# Patient Record
Sex: Female | Born: 1960 | Race: White | Hispanic: No | Marital: Married | Smoking: Former smoker
Health system: Southern US, Community
[De-identification: ages and names within clinical notes are randomized; demographics above are authoritative.]

## PROBLEM LIST (undated history)

## (undated) DIAGNOSIS — D649 Anemia, unspecified: Secondary | ICD-10-CM

## (undated) DIAGNOSIS — G4730 Sleep apnea, unspecified: Secondary | ICD-10-CM

## (undated) DIAGNOSIS — E669 Obesity, unspecified: Secondary | ICD-10-CM

## (undated) DIAGNOSIS — J189 Pneumonia, unspecified organism: Secondary | ICD-10-CM

## (undated) DIAGNOSIS — K5900 Constipation, unspecified: Secondary | ICD-10-CM

## (undated) DIAGNOSIS — Z8759 Personal history of other complications of pregnancy, childbirth and the puerperium: Secondary | ICD-10-CM

## (undated) DIAGNOSIS — Z86718 Personal history of other venous thrombosis and embolism: Secondary | ICD-10-CM

## (undated) DIAGNOSIS — H109 Unspecified conjunctivitis: Secondary | ICD-10-CM

## (undated) DIAGNOSIS — H269 Unspecified cataract: Secondary | ICD-10-CM

## (undated) DIAGNOSIS — D039 Melanoma in situ, unspecified: Secondary | ICD-10-CM

## (undated) DIAGNOSIS — M899 Disorder of bone, unspecified: Secondary | ICD-10-CM

## (undated) DIAGNOSIS — C801 Malignant (primary) neoplasm, unspecified: Secondary | ICD-10-CM

## (undated) DIAGNOSIS — M1990 Unspecified osteoarthritis, unspecified site: Secondary | ICD-10-CM

## (undated) DIAGNOSIS — T7840XA Allergy, unspecified, initial encounter: Secondary | ICD-10-CM

## (undated) DIAGNOSIS — L570 Actinic keratosis: Secondary | ICD-10-CM

## (undated) DIAGNOSIS — E7800 Pure hypercholesterolemia, unspecified: Secondary | ICD-10-CM

## (undated) DIAGNOSIS — R140 Abdominal distension (gaseous): Secondary | ICD-10-CM

## (undated) DIAGNOSIS — I10 Essential (primary) hypertension: Secondary | ICD-10-CM

## (undated) HISTORY — DX: Allergy, unspecified, initial encounter: T78.40XA

## (undated) HISTORY — DX: Constipation, unspecified: K59.00

## (undated) HISTORY — DX: Pneumonia, unspecified organism: J18.9

## (undated) HISTORY — DX: Essential (primary) hypertension: I10

## (undated) HISTORY — DX: Unspecified cataract: H26.9

## (undated) HISTORY — PX: EYE SURGERY: SHX253

## (undated) HISTORY — DX: Abdominal distension (gaseous): R14.0

## (undated) HISTORY — DX: Pure hypercholesterolemia, unspecified: E78.00

## (undated) HISTORY — PX: APPENDECTOMY: SHX54

## (undated) HISTORY — DX: Personal history of other venous thrombosis and embolism: Z86.718

## (undated) HISTORY — DX: Melanoma in situ, unspecified: D03.9

## (undated) HISTORY — DX: Unspecified osteoarthritis, unspecified site: M19.90

## (undated) HISTORY — PX: MELANOMA EXCISION: SHX5266

## (undated) HISTORY — PX: TONSILLECTOMY: SUR1361

## (undated) HISTORY — PX: VEIN LIGATION AND STRIPPING: SHX2653

## (undated) HISTORY — DX: Malignant (primary) neoplasm, unspecified: C80.1

## (undated) HISTORY — PX: HERNIA REPAIR: SHX51

## (undated) HISTORY — DX: Sleep apnea, unspecified: G47.30

## (undated) HISTORY — DX: Unspecified conjunctivitis: H10.9

## (undated) HISTORY — DX: Anemia, unspecified: D64.9

## (undated) HISTORY — DX: Personal history of other complications of pregnancy, childbirth and the puerperium: Z87.59

## (undated) HISTORY — DX: Obesity, unspecified: E66.9

## (undated) HISTORY — DX: Actinic keratosis: L57.0

## (undated) HISTORY — DX: Disorder of bone, unspecified: M89.9

---
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
## 1978-01-23 HISTORY — PX: APPENDECTOMY: SHX54

## 1978-01-23 HISTORY — PX: OVARIAN CYST SURGERY: SHX726

## 1986-01-23 HISTORY — PX: HERNIA REPAIR: SHX51

## 1992-01-24 HISTORY — PX: ABDOMINAL HYSTERECTOMY: SHX81

## 1999-01-24 HISTORY — PX: THORACOTOMY: SHX5074

## 2012-11-12 ENCOUNTER — Encounter: Payer: Self-pay | Admitting: *Deleted

## 2012-11-12 ENCOUNTER — Encounter: Payer: Self-pay | Admitting: Cardiovascular Disease

## 2012-11-12 DIAGNOSIS — E669 Obesity, unspecified: Secondary | ICD-10-CM

## 2012-11-12 DIAGNOSIS — L570 Actinic keratosis: Secondary | ICD-10-CM | POA: Insufficient documentation

## 2012-11-12 DIAGNOSIS — E7800 Pure hypercholesterolemia, unspecified: Secondary | ICD-10-CM

## 2012-11-12 DIAGNOSIS — H109 Unspecified conjunctivitis: Secondary | ICD-10-CM

## 2012-11-12 DIAGNOSIS — M899 Disorder of bone, unspecified: Secondary | ICD-10-CM

## 2012-11-12 DIAGNOSIS — E785 Hyperlipidemia, unspecified: Secondary | ICD-10-CM | POA: Insufficient documentation

## 2012-11-13 ENCOUNTER — Ambulatory Visit (INDEPENDENT_AMBULATORY_CARE_PROVIDER_SITE_OTHER): Payer: BC Managed Care – PPO | Admitting: Cardiovascular Disease

## 2012-11-13 ENCOUNTER — Encounter: Payer: Self-pay | Admitting: Cardiovascular Disease

## 2012-11-13 VITALS — BP 150/94 | HR 61 | Ht 66.3 in | Wt 203.0 lb

## 2012-11-13 DIAGNOSIS — R079 Chest pain, unspecified: Secondary | ICD-10-CM | POA: Insufficient documentation

## 2012-11-13 DIAGNOSIS — R1013 Epigastric pain: Secondary | ICD-10-CM

## 2012-11-13 DIAGNOSIS — E7800 Pure hypercholesterolemia, unspecified: Secondary | ICD-10-CM

## 2012-11-13 DIAGNOSIS — K3189 Other diseases of stomach and duodenum: Secondary | ICD-10-CM

## 2012-11-13 DIAGNOSIS — R109 Unspecified abdominal pain: Secondary | ICD-10-CM

## 2012-11-13 NOTE — Assessment & Plan Note (Signed)
Atypical Seems more GI related.  F/U GB US.  With normal ECG can have ETT.  History of paraganglioma with facieal flushing that has returned F/U abdominal and chest CT with contrast

## 2012-11-13 NOTE — Progress Notes (Signed)
Patient ID: Leah Todd, female   DOB: [birth year], 52 y.o.   MRN: [id number] 52 yo referred by Dr Sheria Lang for chest pain.  Patient has had two episodes most recently Monday with chest and epigastric pain.  Associated nausea diaphoresis.  Mondays episode occurred at work after eating a left over lunch.  Sudden onset severe epigastric pain radiating to chest.  Went to bathroom and had diarhea.  Felt fine in about an hour. No history of CAD. 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  No history of GERD or GB disease.  CRF;s elevated lipids and family history     ROS: Denies fever, malais, weight loss, blurry vision, decreased visual acuity, cough, sputum, SOB, hemoptysis, pleuritic pain, palpitaitons, heartburn, abdominal pain, melena, lower extremity edema, claudication, or rash.  All other systems reviewed and negative   General: Affect appropriate Healthy:  appears stated age HEENT: normal Neck supple with no adenopathy JVP normal no bruits no thyromegaly Lungs clear with no wheezing and good diaphragmatic motion Heart:  S1/S2 no murmur,rub, gallop or click PMI normal Abdomen: benighn, BS positve, no tenderness, no AAA no bruit.  No HSM or HJR Distal pulses intact with no bruits No edema Neuro non-focal Skin warm and dry No muscular weakness  Medications Current Outpatient Prescriptions  Medication Sig Dispense Refill  . aspirin 81 MG tablet Take 81 mg by mouth daily.      Marland Kitchen atorvastatin (LIPITOR) 10 MG tablet Take 10 mg by mouth daily.      . Nutritional Supplements (ESTROVEN PO) Take 1 tablet by mouth daily.      Marland Kitchen terbinafine (LAMISIL) 250 MG tablet Take one tablet daily       No current facility-administered medications for this visit.    Allergies Codeine; Erythromycin; Penicillins; and Tetracyclines & related  Family History: No family history on file.  Social History: History   Social History  . Marital Status: Married    Spouse  Name: N/A    Number of Children: N/A  . Years of Education: N/A   Occupational History  . Not on file.   Social History Main Topics  . Smoking status: Former Games developer  . Smokeless tobacco: Not on file  . Alcohol Use: Yes     Comment: occasional  . Drug Use: No  . Sexual Activity: Not on file   Other Topics Concern  . Not on file   Social History Narrative  . No narrative on file    Electrocardiogram:  Today NsR rate 61 normal no change compared to 11/12/12 rate 72 in [facility] office   Assessment and Plan

## 2012-11-13 NOTE — Patient Instructions (Signed)
Your physician recommends that you schedule a follow-up appointment in:  AS NEEDED  Your physician recommends that you continue on your current medications as directed. Please refer to the Current Medication list given to you today.  CHEST AND ABD  CT  RUQ ULTRASOUND Your physician has requested that you have an exercise tolerance test. For further information please visit [email or web address] Please also follow instruction sheet, as given.

## 2012-11-13 NOTE — Assessment & Plan Note (Signed)
Cholesterol is at goal.  Continue current dose of statin and diet Rx.  No myalgias or side effects.  F/U  LFT's in 6 months. No results found for this basename: LDLCALC  LDL 96 primary labs this year

## 2012-11-15 ENCOUNTER — Ambulatory Visit (INDEPENDENT_AMBULATORY_CARE_PROVIDER_SITE_OTHER)
Admission: RE | Admit: 2012-11-15 | Discharge: 2012-11-15 | Disposition: A | Discharge status: Home / Self Care | Payer: BC Managed Care – PPO | Source: Ambulatory Visit | Attending: Cardiovascular Disease | Admitting: Cardiovascular Disease

## 2012-11-15 ENCOUNTER — Ambulatory Visit
Admission: RE | Admit: 2012-11-15 | Discharge: 2012-11-15 | Disposition: A | Discharge status: Home / Self Care | Payer: BC Managed Care – PPO | Source: Ambulatory Visit | Attending: Cardiovascular Disease | Admitting: Cardiovascular Disease

## 2012-11-15 DIAGNOSIS — R079 Chest pain, unspecified: Secondary | ICD-10-CM

## 2012-11-15 DIAGNOSIS — K3189 Other diseases of stomach and duodenum: Secondary | ICD-10-CM

## 2012-11-15 DIAGNOSIS — R109 Unspecified abdominal pain: Secondary | ICD-10-CM

## 2012-11-15 DIAGNOSIS — R1013 Epigastric pain: Secondary | ICD-10-CM

## 2012-11-15 MED ORDER — IOHEXOL 300 MG/ML  SOLN
100.0000 mL | Freq: Once | INTRAMUSCULAR | Status: AC | PRN
  Administered 2012-11-15: 100 mL via INTRAVENOUS

## 2012-11-18 ENCOUNTER — Telehealth: Payer: Self-pay | Admitting: Cardiovascular Disease

## 2012-11-18 NOTE — Telephone Encounter (Signed)
I advised patient that tests have not been reviewed by Dr. Eden Emms and that we will notify her once he has reviewed.  Patient verbalized understanding

## 2012-11-18 NOTE — Telephone Encounter (Signed)
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  Should have her primary help arrange this

## 2012-11-18 NOTE — Telephone Encounter (Signed)
New problem:  Pt states she would like to hear the results of her most recent test results. Please advise... Also, pt states please note her area code is 330 not 336... Thanks

## 2012-11-19 NOTE — Telephone Encounter (Signed)
Yes still needs ETT was referred for chest pain

## 2012-11-19 NOTE — Telephone Encounter (Signed)
I reviewed results with patient who would like copies of reports sent to her PCP, Dr. Juleen China at [facility] in [place].  Patient would like to know if Dr. Eden Emms still recommends the ETT in December.  I advised that he has not stated otherwise, but that I will verify with him and call her back.  Patient verbalized understanding.

## 2012-11-19 NOTE — Telephone Encounter (Signed)
Left patient a message regarding Dr. Fabio Bering advice that patient have ETT for evaluation of chest pain and advised patient to call back with questions or concerns.

## 2012-12-30 ENCOUNTER — Ambulatory Visit (INDEPENDENT_AMBULATORY_CARE_PROVIDER_SITE_OTHER): Payer: BC Managed Care – PPO | Admitting: Physician Assistant

## 2012-12-30 ENCOUNTER — Encounter (INDEPENDENT_AMBULATORY_CARE_PROVIDER_SITE_OTHER): Payer: Self-pay

## 2012-12-30 DIAGNOSIS — R079 Chest pain, unspecified: Secondary | ICD-10-CM

## 2012-12-30 NOTE — Progress Notes (Signed)
Exercise Treadmill Test  Pre-Exercise Testing Evaluation Rhythm: normal sinus  Rate: 66     Test  Exercise Tolerance Test Ordering MD: Charlton Haws, MD  Interpreting MD: Tereso Newcomer, PA-C  Unique Test No: 1  Treadmill:  1  Indication for ETT: chest pain - rule out ischemia  Contraindication to ETT: No   Stress Modality: exercise - treadmill  Cardiac Imaging Performed: non   Protocol: standard Bruce - maximal  Max BP:  189/80  Max MPHR (bpm):  166 85% MPR (bpm):  141  MPHR obtained (bpm):  151 % MPHR obtained:  91  Reached 85% MPHR (min:sec):  4:27 Total Exercise Time (min-sec):  6:00  Workload in METS:  6.8 Borg Scale: 14  Reason ETT Terminated:  patient's desire to stop    ST Segment Analysis At Rest: non-specific ST segment slurring With Exercise: non-specific ST changes  Other Information Arrhythmia:  No Angina during ETT:  absent (0) Quality of ETT:  diagnostic  ETT Interpretation:  normal - no evidence of ischemia by ST analysis  Comments: Fair exercise capacity. No chest pain. Normal BP response to exercise. No ST changes to suggest ischemia.   Recommendations: F/u with Dr. Charlton Haws as directed. Signed,  Tereso Newcomer, PA-C   12/30/2012 10:06 AM

## 2012-12-30 NOTE — Progress Notes (Signed)
Normal stress test

## 2015-01-21 IMAGING — US US ABDOMEN LIMITED
1 series · 14 of 25 positions shown · non-contrast
Comparison: None.

CLINICAL DATA: Epigastric pain

EXAM:
US ABDOMEN LIMITED - RIGHT UPPER QUADRANT

[Series 1: us abdomen limited · 0.30mm/px · 14 of 41 slices shown]
[im 1/41]
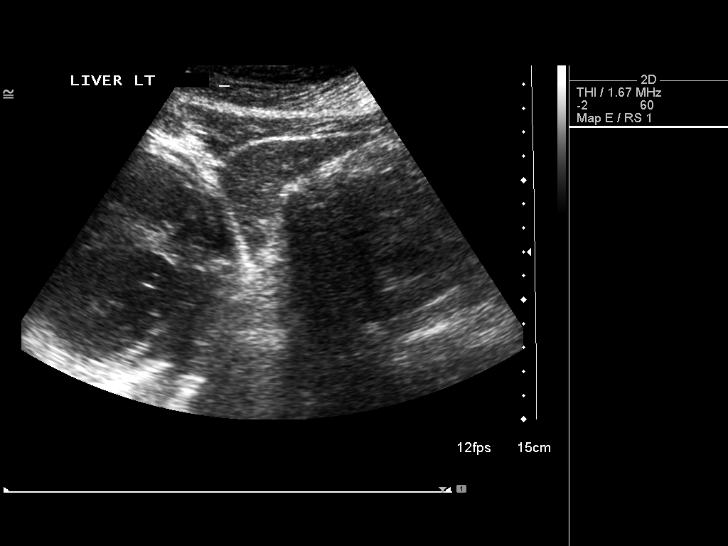
[im 4/41]
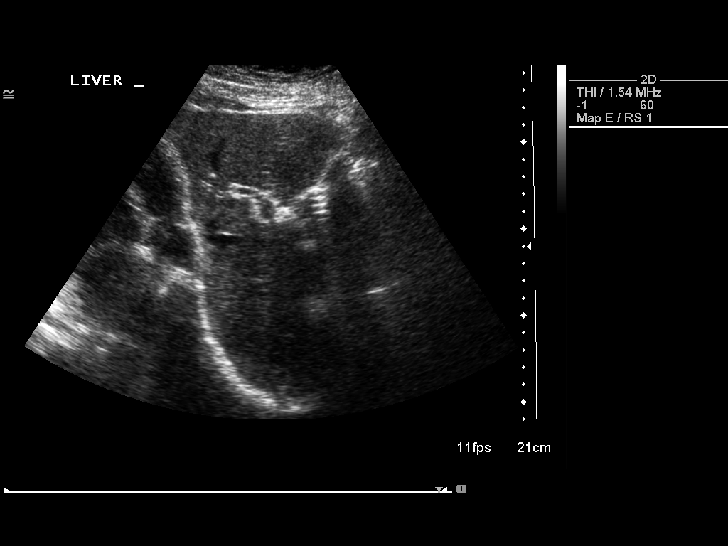
[im 7/41]
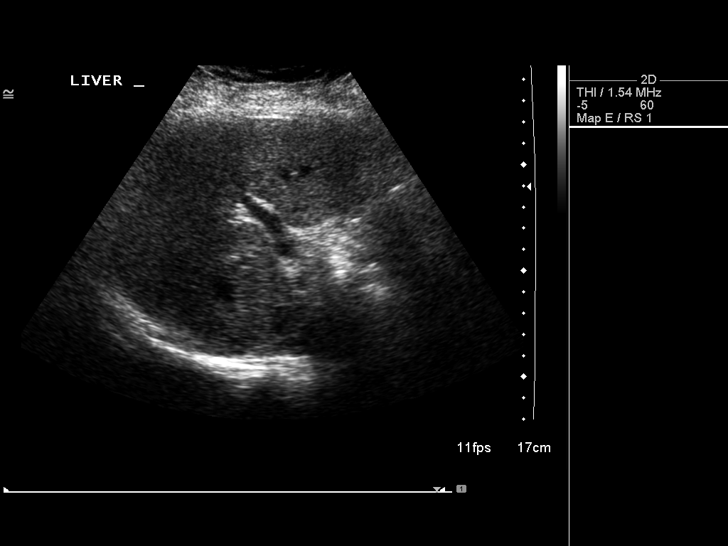
[im 11/41]
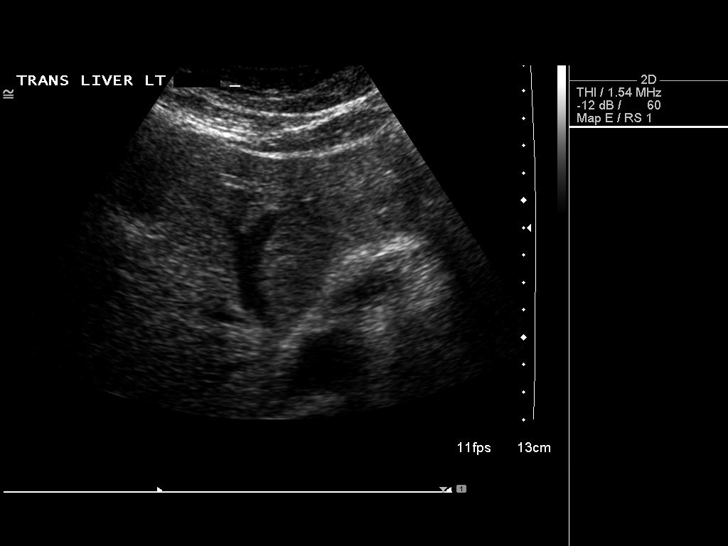
[im 14/41]
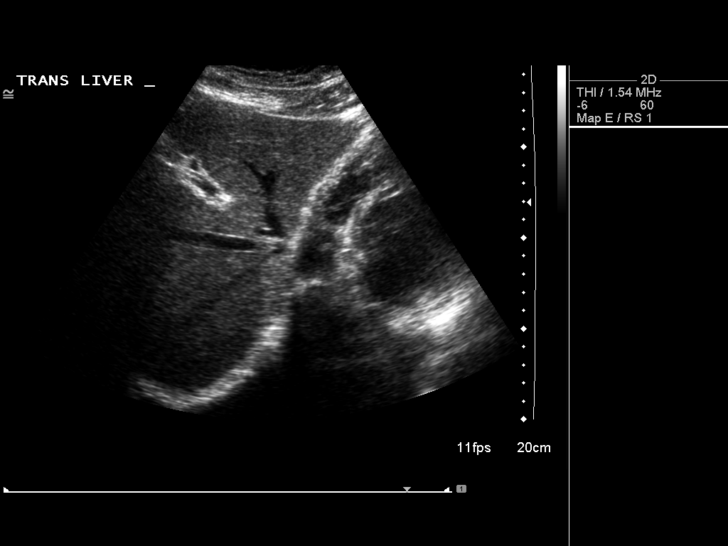
[im 16/41]
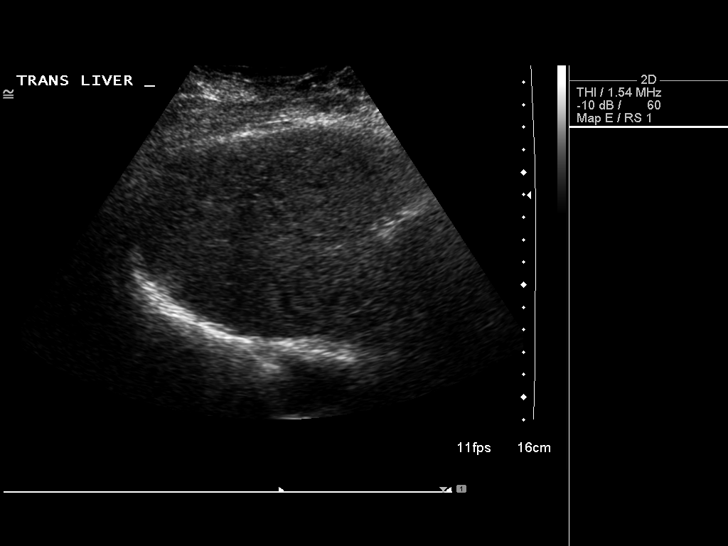
[im 19/41]
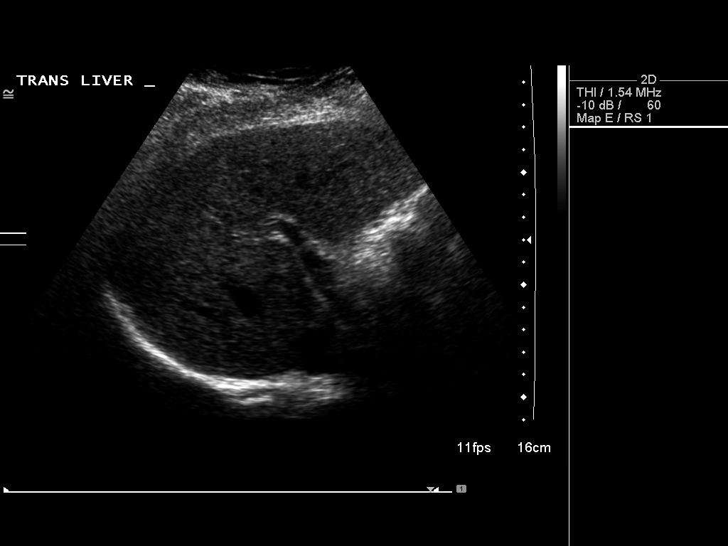
[im 22/41]
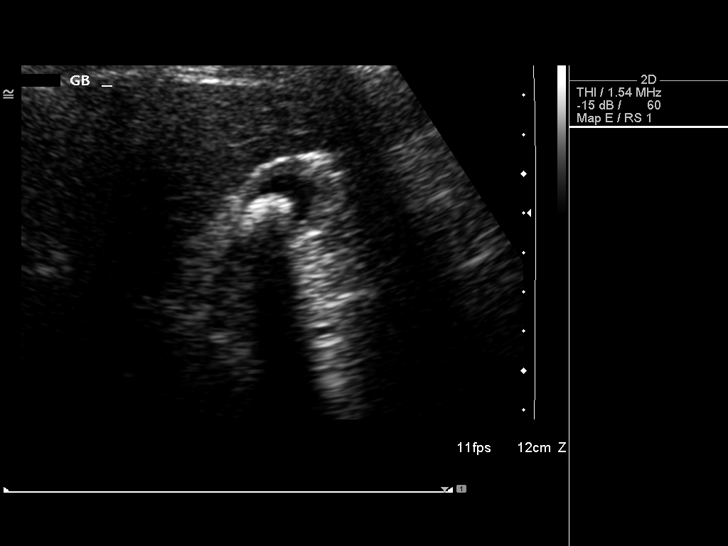
[im 26/41]
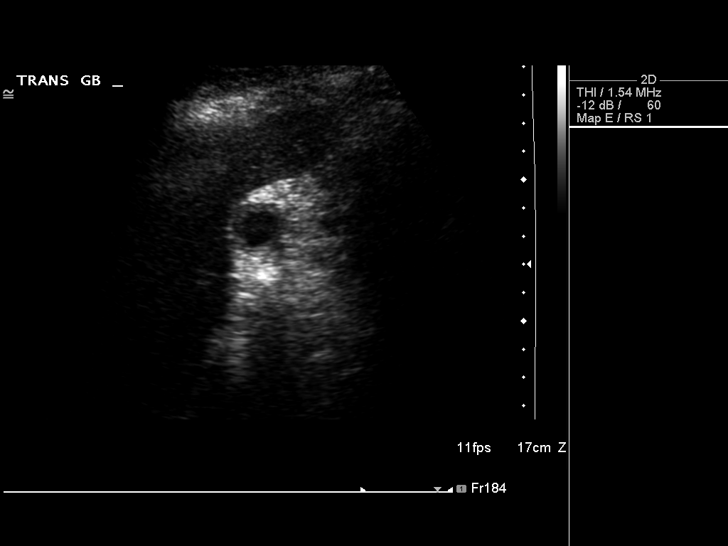
[im 27/41]
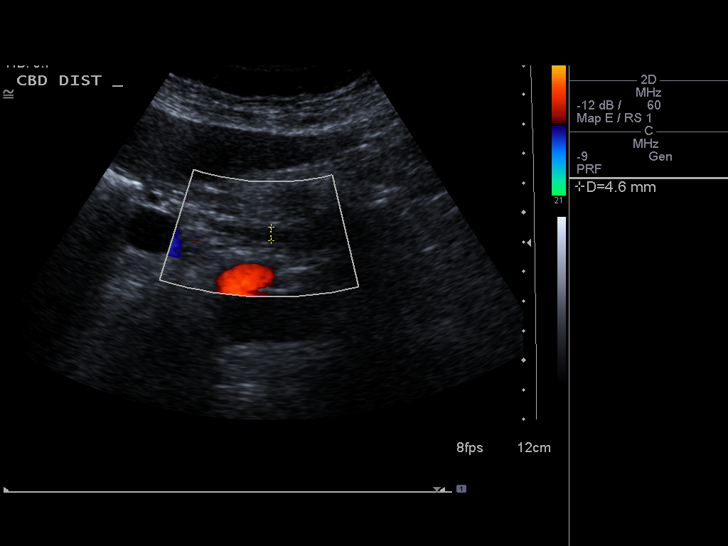
[im 31/41]
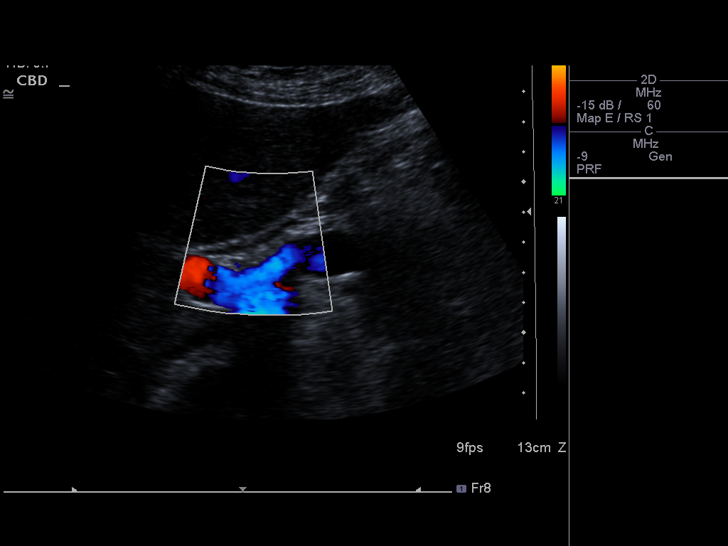
[im 34/41]
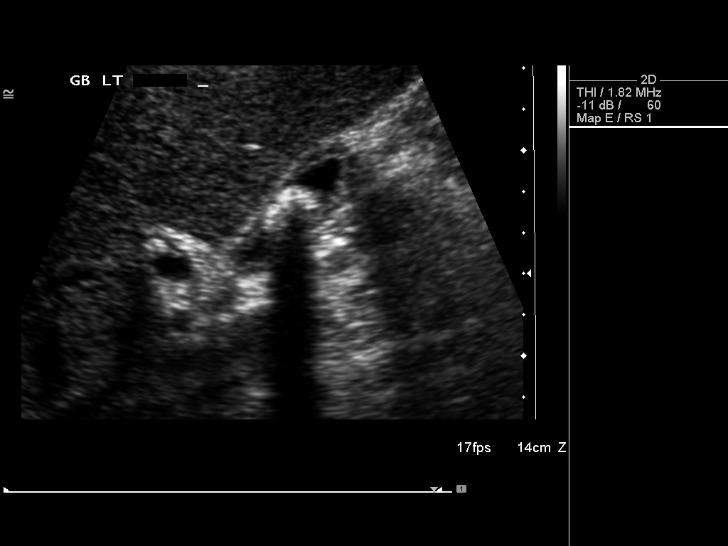
[im 37/41]
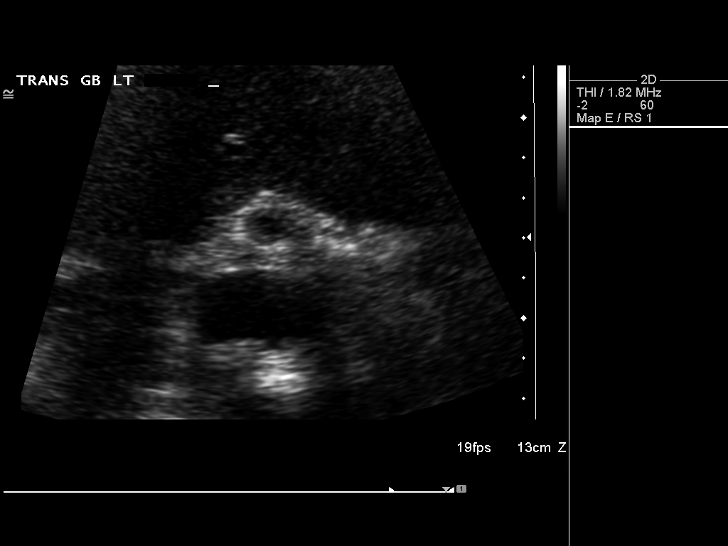
[im 41/41]
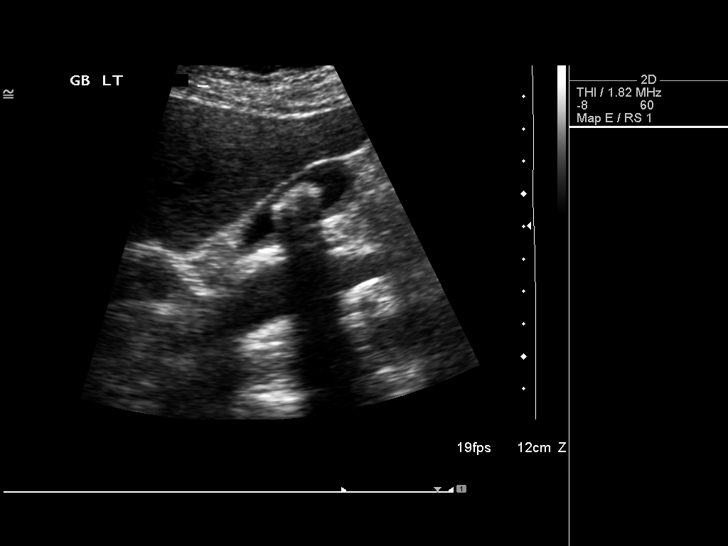

[14 of 25 positions shown; findings below may reference images not displayed]

FINDINGS: Gallbladder

1.8 cm gallstone within the gallbladder. Gallbladder is contracted
with mild wall thickening, 4 mm. Negative sonographic Andrea.

Common bile duct

Diameter: 5 mm.

Liver:

No focal lesion identified. Within normal limits in parenchymal
echogenicity.
IMPRESSION: Cholelithiasis. Contracted gallbladder with slight associated wall
thickening. Negative sonographic Andrea. I suspect the gallbladder
wall thickening is related to the contracted state.

## 2015-01-21 IMAGING — CT CT CHEST W/ CM
4 of 5 series · 17 of 36 positions shown, 19 images · IV contrast (omnipaque)
Comparison: None.

CLINICAL DATA: Chest and abdominal pain. Recurrent episodes of
flushing, diaphoresis and nausea.

EXAM:
CT CHEST AND ABDOMEN WITH CONTRAST
TECHNIQUE: Multidetector CT imaging of the abdomen was performed using the
standard protocol following bolus administration of intravenous
contrast.
CONTRAST:  100mL OMNIPAQUE IOHEXOL 300 MG/ML  SOLN

[Series 2: chest/abd with · axial · 0.73mm/px · z∈[-442,-222]mm · 5 of 89 slices shown]
[im 7/89  lung]
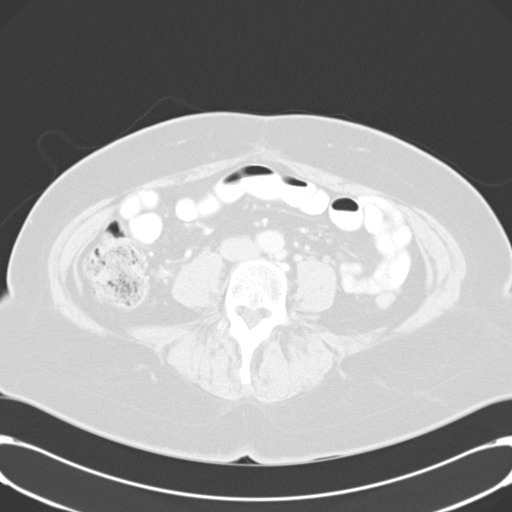
[im 19/89  lung]
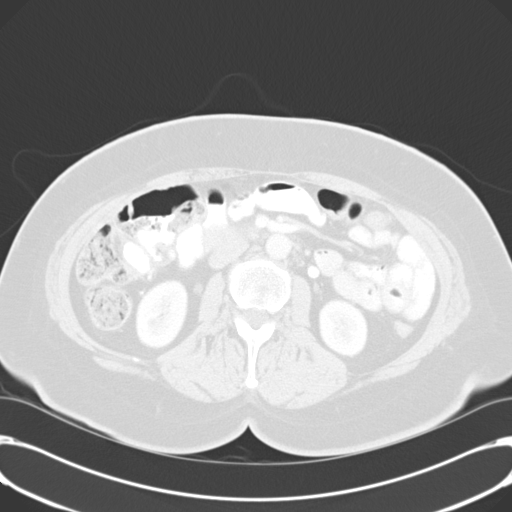
[im 32/89  lung]
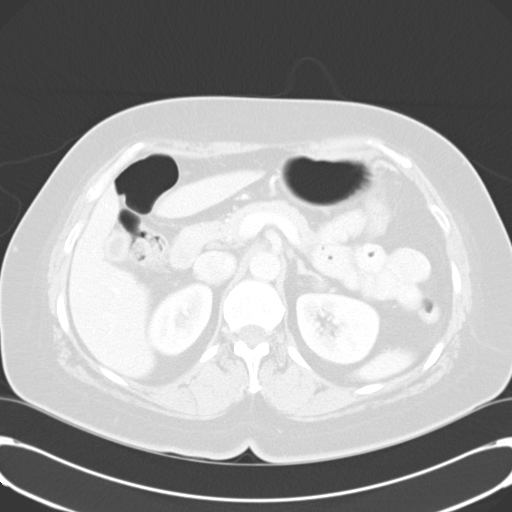
[im 38/89  lung]
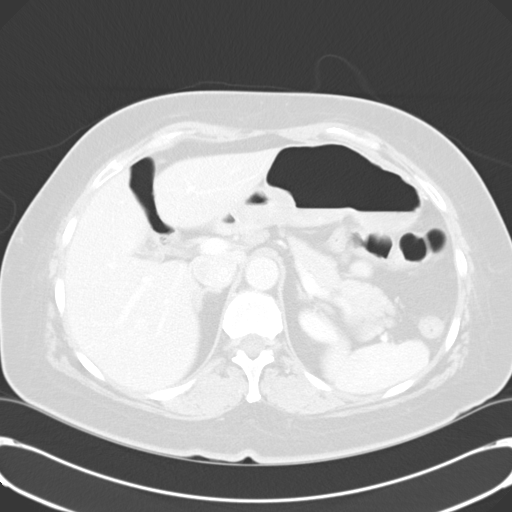
[im 51/89  lung]
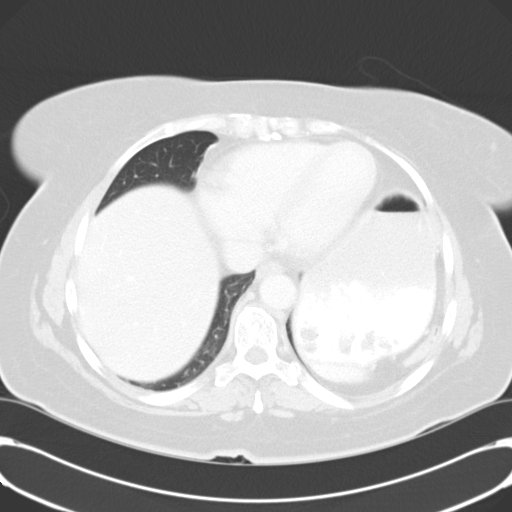

[Series 3: lung · axial · 0.73mm/px · z∈[-216,-66]mm · 6 of 44 slices shown, 8 images]
[im 7/44  mediastinal]
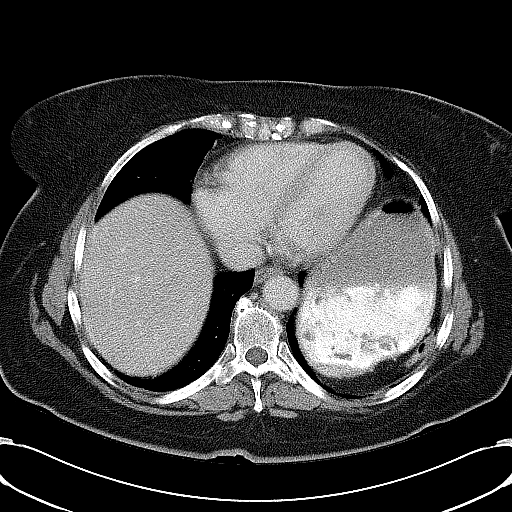
[im 7/44  lung]
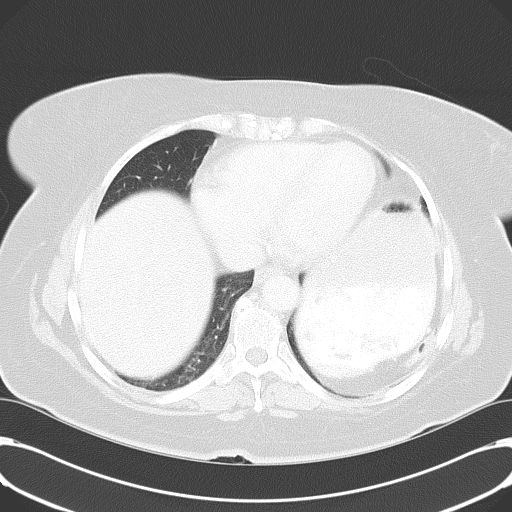
[im 13/44  lung]
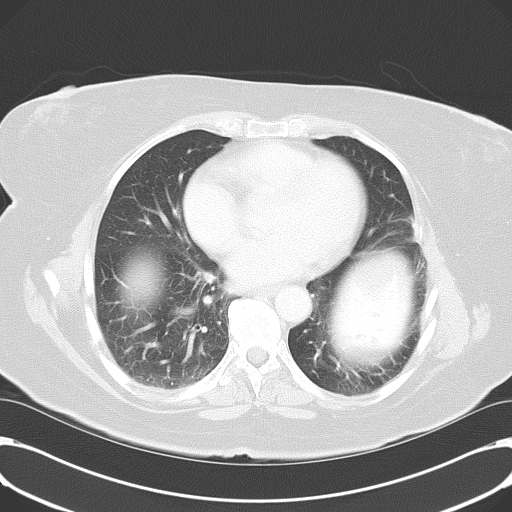
[im 19/44  lung]
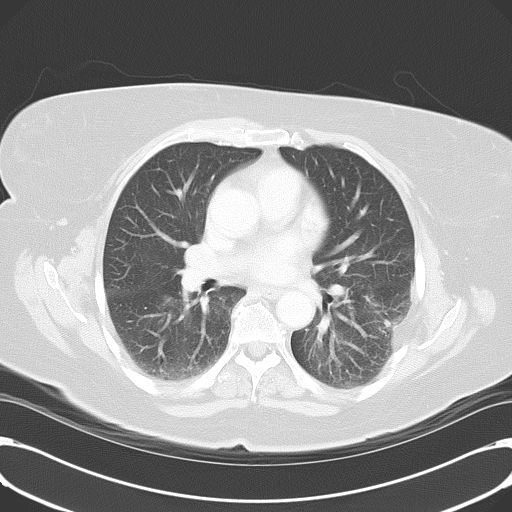
[im 25/44  lung]
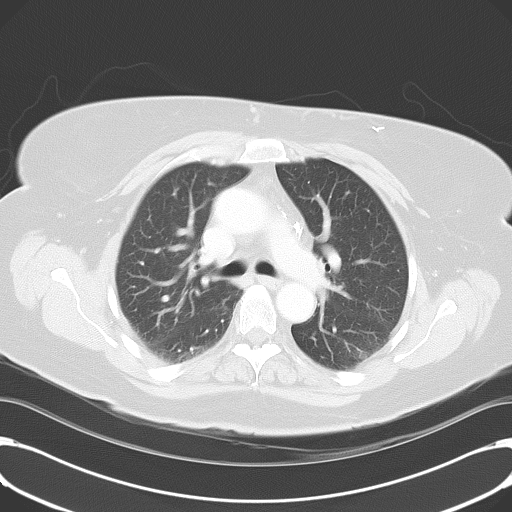
[im 31/44  mediastinal]
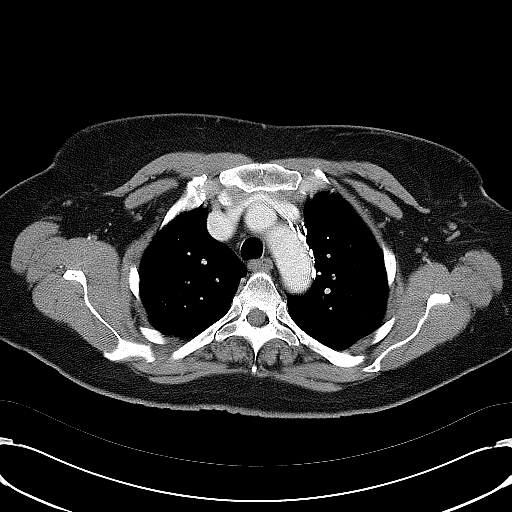
[im 31/44  lung]
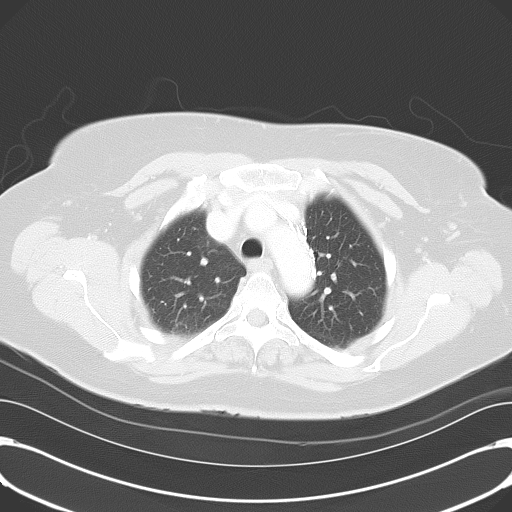
[im 37/44  lung]
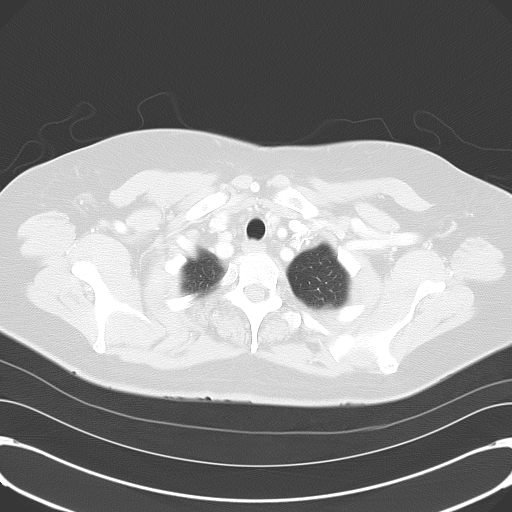

[Series 5: delay · axial · delayed · 0.69mm/px · z∈[-378,-308]mm · 3 of 30 slices shown]
[im 8/30  lung]
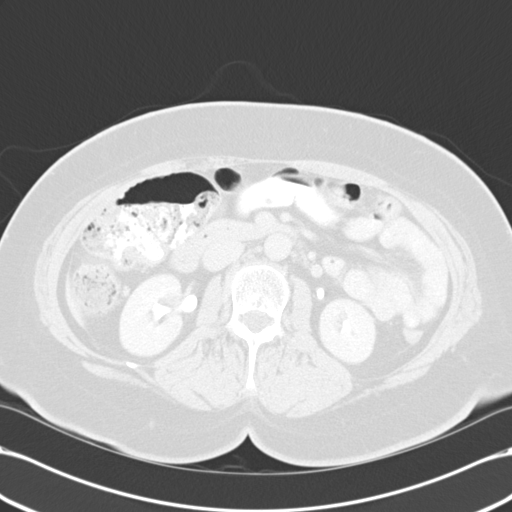
[im 15/30  lung]
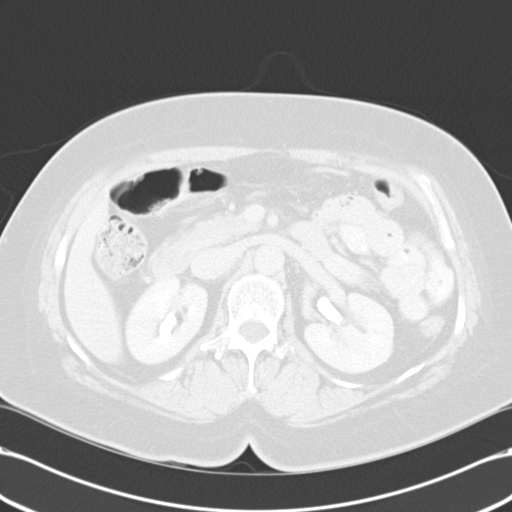
[im 22/30  lung]
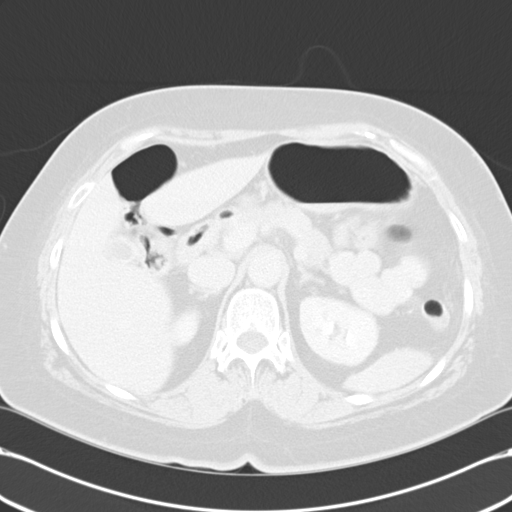

[Series 602: cor · coronal · 0.91mm/px · 3 of 125 slices shown]
[im 25/125  lung]
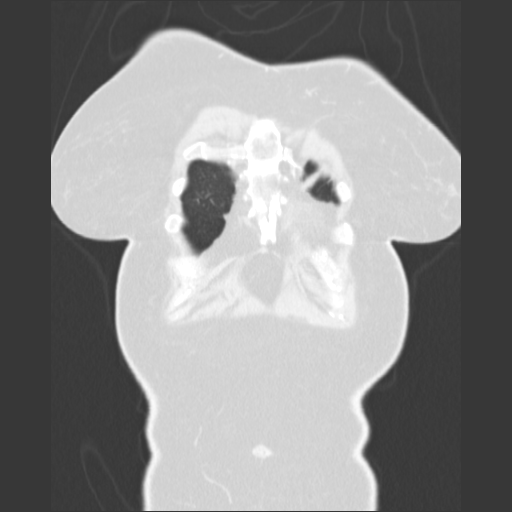
[im 50/125  lung]
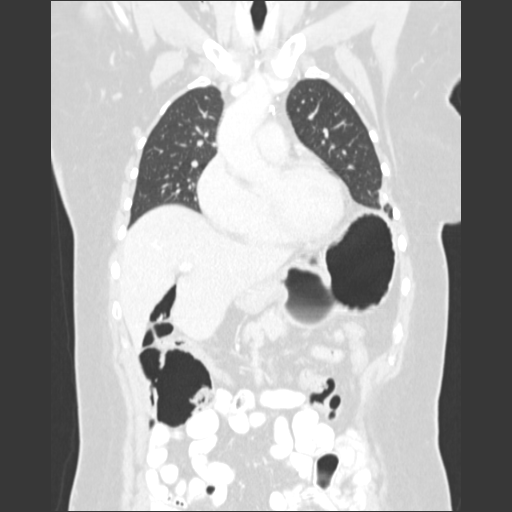
[im 75/125  lung]
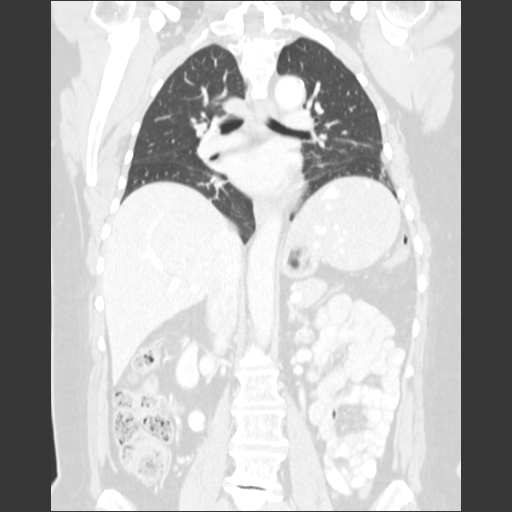

[17 of 36 positions shown; findings below may reference images not displayed]

FINDINGS: Chest CT findings: No evidence of mediastinal or hilar masses. No
adenopathy identified within the thorax.

Postop changes from previous left thoracotomy are noted. Mild
scarring seen in the lateral left lung base and left lung apex. No
evidence of pulmonary infiltrate or central endobronchial lesion. No
suspicious pulmonary nodules or masses are identified. No evidence
of chest wall mass or suspicious bone lesions.

Abdomen CT findings: A left adrenal mass is seen measuring 1.5 x
cm. This shows no significant contrast washout of contrast on
delayed imaging, which is a nonspecific feature. The right adrenal
gland is normal in appearance.

The liver, gallbladder, pancreas, spleen, and kidneys are normal in
appearance. No evidence of hydronephrosis.

No evidence of abdominal lymphadenopathy or other retroperitoneal
masses. No evidence of inflammatory process or abnormal fluid
collections. Visualized abdominal bowel loops are unremarkable.
IMPRESSION: Postop changes in left hemithorax. No thoracic mass or
lymphadenopathy.

2.0 cm nonspecific left adrenal mass. For patient with no prior
cancer history, this is presumably benign and a 12 month followup CT
or MRI is suggested for confirmation. If patient has clinical signs
or symptoms of adrenal hyperfunction, consider biochemical
evaluation. This recommendation follows ACR consensus guidelines:
Managing Incidental Findings on Abdominal CT: White Paper of the ACR
Incidental Findings Committee. [HOSPITAL] [id number];[DATE]

## 2015-11-25 ENCOUNTER — Ambulatory Visit (INDEPENDENT_AMBULATORY_CARE_PROVIDER_SITE_OTHER): Payer: BLUE CROSS/BLUE SHIELD | Admitting: Advanced Practice Midwife

## 2015-11-25 ENCOUNTER — Encounter: Payer: Self-pay | Admitting: Advanced Practice Midwife

## 2015-11-25 VITALS — BP 157/93 | HR 61 | Wt 211.8 lb

## 2015-11-25 DIAGNOSIS — Z86718 Personal history of other venous thrombosis and embolism: Secondary | ICD-10-CM | POA: Diagnosis not present

## 2015-11-25 DIAGNOSIS — N952 Postmenopausal atrophic vaginitis: Secondary | ICD-10-CM

## 2015-11-25 DIAGNOSIS — Z8759 Personal history of other complications of pregnancy, childbirth and the puerperium: Secondary | ICD-10-CM

## 2015-11-25 DIAGNOSIS — N941 Unspecified dyspareunia: Secondary | ICD-10-CM | POA: Diagnosis not present

## 2015-11-25 DIAGNOSIS — R635 Abnormal weight gain: Secondary | ICD-10-CM | POA: Diagnosis not present

## 2015-11-25 LAB — TSH: TSH: 3.19 m[IU]/L

## 2015-11-25 MED ORDER — PRASTERONE 6.5 MG VA INST: 1.0000 | VAGINAL_INSERT | Freq: Every day | VAGINAL | 13 refills | Status: DC

## 2015-11-25 NOTE — Patient Instructions (Signed)

## 2015-11-25 NOTE — Progress Notes (Signed)
Subjective:    Leah Todd is a 55 y.o. female who presents for vaginal dryness, discomfort and lack of interest in sex. Reports near-resolution of other menopausal Sx. States husband wanted her to see if anything could be done for this problem. Also C/O unexplained weight gain. The patient is sexually active. GYN screening history: last pap: ~age 61. Has hysterectomy due to severe pain from adhesions from previous pelvic surgery. No Hx abnormal Paps. and last mammogram: approximate date 05/04/15 and was normal. The patient is not taking hormone replacement therapy. Patient denies post-menopausal vaginal bleeding. The patient participates in regular exercise: no.    Menstrual History: OB History    No data available      No LMP recorded. Patient has had a hysterectomy.    The following portions of the patient's history were reviewed and updated as appropriate: allergies, current medications, past family history, past medical history, past social history, past surgical history and problem list.  Hx DVT postpartum Past Medical History:  Diagnosis Date  . Actinic keratosis   . Bone disorder   . Conjunctivitis unspecified   . Elevated cholesterol   . Obesity     Review of Systems Pertinent items are noted in HPI.    Objective:    BP (!) 157/93   Pulse 61   Wt 211 lb 12.8 oz (96.1 kg)   BMI 33.88 kg/m   General Appearance:    Alert, cooperative, no distress, appears stated age  Head:    Normocephalic, without obvious abnormality, atraumatic  Throat:   Lips, mucosa, and tongue normal; teeth normal  Neck:  Thyroid:  no enlargement/tenderness/nodules  Lungs:     Respirations unlabored   Heart:    Regular rate  Neurologic:   Grossly neurologically intact, normal gait       Assessment:     1. Post-menopausal atrophic vaginitis  - Prasterone 6.5 MG INST; Place 1 suppository vaginally daily.  Dispense: 28 each; Refill: 13  2. Dyspareunia, female  - Prasterone 6.5 MG INST;  Place 1 suppository vaginally daily.  Dispense: 28 each; Refill: 13  3. Unexplained weight gain  - TSH   Plan:  F/U PRN if no improvement in 2-3 months. May also try vaginal moisturize such as Replens. May consider Premarin cream if no improvement, but with caution considering pt's Hx of Estrogen-precipitated DVT.  Discussed Addyi for decreased libido, btu pt is not interested.   [place], CNM 11/24/2015 11:12 PM

## 2015-11-26 ENCOUNTER — Encounter: Payer: Self-pay | Admitting: Advanced Practice Midwife

## 2015-11-26 DIAGNOSIS — Z86718 Personal history of other venous thrombosis and embolism: Secondary | ICD-10-CM

## 2015-11-26 DIAGNOSIS — Z8759 Personal history of other complications of pregnancy, childbirth and the puerperium: Secondary | ICD-10-CM

## 2015-11-26 HISTORY — DX: Personal history of other complications of pregnancy, childbirth and the puerperium: Z87.59

## 2015-11-26 HISTORY — DX: Personal history of other venous thrombosis and embolism: Z86.718

## 2015-12-01 ENCOUNTER — Telehealth: Payer: Self-pay

## 2015-12-01 NOTE — Telephone Encounter (Signed)
Attempted to contact pt x 2 and received a message that number is not a working number.  Letter sent.

## 2020-12-23 HISTORY — PX: RETINAL DETACHMENT SURGERY: SHX105

## 2021-09-27 ENCOUNTER — Encounter: Payer: Self-pay | Admitting: Gastroenterology

## 2021-09-28 ENCOUNTER — Ambulatory Visit: Payer: BLUE CROSS/BLUE SHIELD | Admitting: Gastroenterology

## 2021-10-20 ENCOUNTER — Ambulatory Visit: Payer: BC Managed Care – PPO | Admitting: Pulmonary Disease

## 2021-10-20 ENCOUNTER — Encounter: Payer: Self-pay | Admitting: Pulmonary Disease

## 2021-10-20 VITALS — BP 118/82 | HR 76 | Ht 66.0 in | Wt 215.6 lb

## 2021-10-20 DIAGNOSIS — E873 Alkalosis: Secondary | ICD-10-CM | POA: Diagnosis not present

## 2021-10-20 DIAGNOSIS — R0609 Other forms of dyspnea: Secondary | ICD-10-CM

## 2021-10-20 NOTE — Patient Instructions (Addendum)
Nice to meet you   We will get pulmonary function tests and a home sleep test to evaluate the elevated bicarbonate in your blood  Return to clinic in 3 months or sooner as needed with Dr. Silas Flood  PFT next available

## 2021-10-25 NOTE — Progress Notes (Signed)
$'@Patient'P$  ID: Leah Todd, female    DOB: 12-20-1960, 61 y.o.   MRN: [id number]  Chief Complaint  Patient presents with   Consult    Referring provider: Ronita Hipps, MD  HPI:   61 y.o. woman whom are seen in consultation for evaluation of elevated bicarbonate, presumed metabolic alkalosis.  Note from referring provider reviewed.  Patient had lab work.  This revealed elevated CO2 low 30s.  She denies any significant dyspnea.  Mild exertional dyspnea she feels is normal.  No shortness of breath at rest.  She denies any history of sleep disordered breathing.  No issues sleeping.  Does not wake up at night etc.  No cough.  Reviewed etiologies of elevated bicarbonate for metabolic alkalosis.  We discussed that this often is from chronic respiratory acidosis.  In that work-up would revolve around trying to identify a possible respiratory acidosis or cause of respiratory acidosis.  PMH: Hypertension, hyperlipidemia Family history:History reviewed. No pertinent family history. Surgical history: Appendectomy, C-section, hernia repair, tonsillectomy, VATS Social history: Former smoker, lives in [place] / Pulmonary Flowsheets:   ACT:      No data to display          MMRC:     No data to display          Epworth:      No data to display          Tests:   FENO:  No results found for: "NITRICOXIDE"  PFT:     No data to display          WALK:      No data to display          Imaging: Personally reviewed No results found.  Lab Results: Personally reviewed CBC No results found for: "WBC", "RBC", "HGB", "HCT", "PLT", "MCV", "MCH", "MCHC", "RDW", "LYMPHSABS", "MONOABS", "EOSABS", "BASOSABS"  BMET No results found for: "NA", "K", "CL", "CO2", "GLUCOSE", "BUN", "CREATININE", "CALCIUM", "GFRNONAA", "GFRAA"  BNP No results found for: "BNP"  ProBNP No results found for: "PROBNP"  Specialty Problems   None   Allergies   Allergen Reactions   Codeine    Erythromycin    Penicillins    Tetracyclines & Related      There is no immunization history on file for this patient.  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   atorvastatin (LIPITOR) 10 MG tablet Take 10 mg by mouth daily.   b complex vitamins capsule Take 1 capsule by mouth daily.   Calcium Carbonate-Vitamin D (OYSTER SHELL CALCIUM/D) 500-5 MG-MCG TABS Take 1 tablet by mouth daily.   hydrochlorothiazide (MICROZIDE) 12.5 MG capsule Take 12.5 mg by mouth as needed.   Multiple Vitamin (MULTIVITAMIN) tablet Take 1 tablet by mouth daily.   Probiotic Product (PROBIOTIC PO) Take by mouth.   [DISCONTINUED] Nutritional Supplements (ESTROVEN PO) Take 1 tablet by mouth daily.   [DISCONTINUED] Prasterone 6.5 MG INST Place 1 suppository vaginally daily.   [  DISCONTINUED] terbinafine (LAMISIL) 250 MG tablet Take one tablet daily   No facility-administered encounter medications on file as of 10/20/2021.     Review of Systems  Review of Systems  No chest pain with exertion.  No orthopnea or PND.  Comprehensive review of systems otherwise negative. Physical Exam  BP 118/82 (BP Location: Left Arm, Cuff Size: Normal)   Pulse 76   Ht '5\' 6"'$  (1.676 m)   Wt 215 lb 9.6 oz (97.8 kg)   SpO2 97%   BMI 34.80 kg/m   Wt Readings from Last 5 Encounters:  10/20/21 215 lb 9.6 oz (97.8 kg)  11/25/15 211 lb 12.8 oz (96.1 kg)  11/13/12 203 lb (92.1 kg)    BMI Readings from Last 5 Encounters:  10/20/21 34.80 kg/m   11/25/15 33.88 kg/m  11/13/12 32.47 kg/m     Physical Exam General: Sitting in chair, no acute distress Eyes: EOMI, no icterus Neck: Supple, no JVP Pulmonary: Clear, normal work of breathing Cardiovascular: Warm, no edema Abdomen: Nondistended, bowel sounds present MSK: No synovitis, no joint effusion Neuro: Normal gait, no weakness Psych: Normal mood, full affect   Assessment & Plan:   Elevated Bicarbonate on routine blood tests/metabolic alkalosis: Unclear etiology.  Presumably compensation for respiratory acidosis.  Highest suspicion of OHS/atelectasis due to habitus, weight gain. Possible OSA. PFTs for further evaluation of possible obstructive lung disease which could be contributing. Home sleep study to evaluate for OSA.  All are agreeable, consider nephrology consultation.  Abnormal CT scan: 2014 with rounded appearing left-sided pleural change.  This is likely postprocedural following VATS for procedural hemothorax.  No further follow-up needed.  Return in about 3 months (around 01/19/2022).   Lanier Clam, MD 10/25/2021

## 2021-11-11 ENCOUNTER — Ambulatory Visit: Payer: BC Managed Care – PPO | Admitting: Gastroenterology

## 2021-11-11 ENCOUNTER — Encounter: Payer: Self-pay | Admitting: Gastroenterology

## 2021-11-11 VITALS — BP 136/82 | HR 68 | Ht 66.0 in | Wt 215.0 lb

## 2021-11-11 DIAGNOSIS — R143 Flatulence: Secondary | ICD-10-CM

## 2021-11-11 DIAGNOSIS — Z1211 Encounter for screening for malignant neoplasm of colon: Secondary | ICD-10-CM

## 2021-11-11 DIAGNOSIS — R140 Abdominal distension (gaseous): Secondary | ICD-10-CM | POA: Diagnosis not present

## 2021-11-11 MED ORDER — NA SULFATE-K SULFATE-MG SULF 17.5-3.13-1.6 GM/177ML PO SOLN: 1.0000 | Freq: Once | ORAL | 0 refills | Status: AC

## 2021-11-11 NOTE — Patient Instructions (Addendum)
It was a pleasure to meet you today.  We discussed a colonoscopy for colon cancer screening.  Some food may make your gas and bloating worse. Please avoid carbonated beverages, caffeinated beverages, sugar substitutes, hard candies, chewing gum and other gas-producing foods such as legumes, onions, cabbage, brussel sprouts, and artificial sweeteners. Do not gulp foods or liquids!   You have been scheduled for a colonoscopy. Please follow written instructions given to you at your visit today.  Please pick up your prep supplies at the pharmacy within the next 1-3 days. If you use inhalers (even only as needed), please bring them with you on the day of your procedure.   COLONOSCOPY TIPS:  To reduce nausea and dehydration, stay well hydrated for 3-4 days prior to the exam.  To prevent skin/hemorrhoid irritation - prior to wiping, put A&Dointment or vaseline on the toilet paper. Keep a towel or pad on the bed.  BEFORE STARTING YOUR PREP, drink  64oz of clear liquids in the morning. This will help to flush the colon and will ensure you are well hydrated!!!!  NOTE - This is in addition to the fluids required for to complete your prep. Use of a flavored hard candy, such as grape Anise Salvo, can counteract some of the flavor of the prep and may prevent some nausea.

## 2021-11-11 NOTE — Progress Notes (Signed)
Referring Provider: Ronita Hipps, MD Primary Care Physician:  Ronita Hipps, MD   Reason for Consultation:  Colonoscopy, Gas   IMPRESSION:  History of colon polyps.  Pathology unknown.  She is due for colon cancer screening as the last colonoscopy was over 10 years ago.  Gas and flatus.  No alarm features. Will start with dietary changes to minimize symptoms. If symptoms persist or alarm features develop, will plan cross-sectional imaging and consider medical therapy for constipation +/- SIBO.   PLAN: - Colonoscopy - Patient instructions for dietary recommendations   HPI: Leah Todd is a 61 y.o. female referred for colonoscopy.  This visit was scheduled prior to performing due to patient's complaints of gas.  The history is obtained through the patient, review of her electronic health record, and paper records provided by [facility] in [place].  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  She had a hysterectomy in 1994, appendectomy with oophorectomy in 1980, and a hernia repair in 1988. She is retired.   Presents for colonoscopy. She had some small polyps removed on her first colonoscopy 10 years ago with Dr. Lyda Jester.  She understood that she should have another exam in 10 years.  She has chronic gas with bloating and frequent flatus. Bowel habits fluctuate but she does not ever have diarrhea. Sometimes she will go a couple of days between bowel movements.  Gas is most frequent in the morning and the evenings after dinner and may be worse on days when she is constipated.  She will use GasEx if it gets bad. No improvement with fiber. No alarm features.   She drinks two cups of coffee every day. No significant consumption of carbonated beverages, sugar substitutes, hard candies, or chewing gum. She does not smoke.  She noted some decrease in her memory after anesthesia for eye surgery 12/23.  She thinks she was over Gast and finds  that she is no longer able to concentrate on more than one task at a time.  Recent labs show hemoglobin A1c 5.3, hemoglobin 12.7, MCV 90, RDW 13.1, platelets 256, normal liver enzymes, normal TSH  There is no known family history of colon cancer or polyps. No family history of stomach cancer or other GI malignancy. No family history of inflammatory bowel disease or celiac.    Past Medical History:  Diagnosis Date   Actinic keratosis    Bloating    Bone disorder    Cancer (HCC)    Conjunctivitis unspecified    Constipation    Elevated cholesterol    History of maternal deep vein thrombosis (DVT) 11/26/2015   Hypertension    Melanoma in situ ([place])    Obesity    Pneumonia     Past Surgical History:  Procedure Laterality Date   ABDOMINAL HYSTERECTOMY  [place]   RETINAL DETACHMENT SURGERY Left 12/2020   THORACOTOMY  2001   TONSILLECTOMY  1972   VEIN LIGATION AND [place], 2006 & 2014   Current Outpatient Medications  Medication Sig Dispense Refill   aspirin EC 81 MG tablet Take 81 mg by mouth daily.     atorvastatin (LIPITOR) 10 MG tablet Take 10 mg by mouth daily.  Calcium Carbonate-Vitamin D (OYSTER SHELL CALCIUM/D) 500-5 MG-MCG TABS Take 1 tablet by mouth daily.     hydrochlorothiazide (MICROZIDE) 12.5 MG capsule Take 12.5 mg by mouth as needed.     Multiple Vitamin (MULTIVITAMIN) tablet Take 1 tablet by mouth daily.     NON FORMULARY Pt taking 1 Golo a day     Probiotic Product (PROBIOTIC PO) Take by mouth.     b complex vitamins capsule Take 1 capsule by mouth daily. (Patient not taking: Reported on 11/11/2021)     No current facility-administered medications for this visit.    Allergies as of 11/11/2021 - Review Complete 11/11/2021  Allergen Reaction Noted   Codeine  11/12/2012   Erythromycin  11/12/2012    Penicillins  11/12/2012   Tetracyclines & related  11/12/2012    Family History  Problem Relation Age of Onset   Stroke Mother    Bone cancer Father    Heart attack Father    Hypertension Sister    Hypertension Brother    Colon polyps Brother    Stomach cancer Brother    Diabetes Maternal Grandmother    Diabetes Maternal Grandfather    Diabetes Paternal Grandmother    Colon cancer Neg Hx    Esophageal cancer Neg Hx     Social History   Socioeconomic History   Marital status: Married    Spouse name: Not on file   Number of children: 2   Years of education: Not on file   Highest education level: Not on file  Occupational History   Occupation: retired  Tobacco Use   Smoking status: Former   Smokeless tobacco: Not on Landscape architect Use: Never used  Substance and Sexual Activity   Alcohol use: Not Currently    Comment: occasional   Drug use: No   Sexual activity: Not on file  Other Topics Concern   Not on file  Social History Narrative   Not on file   Social Determinants of Health   Financial Resource Strain: Not on file  Food Insecurity: Not on file  Transportation Needs: Not on file  Physical Activity: Not on file  Stress: Not on file  Social Connections: Not on file  Intimate Partner Violence: Not on file    Review of Systems: 12 system ROS is negative except as noted above.   Physical Exam: General:   Alert,  well-nourished, pleasant and cooperative in NAD Head:  Normocephalic and atraumatic. Eyes:  Sclera clear, no icterus.   Conjunctiva pink. Ears:  Normal auditory acuity. Nose:  No deformity, discharge,  or lesions. Mouth:  No deformity or lesions.   Neck:  Supple; no masses or thyromegaly. Lungs:  Clear throughout to auscultation.   No wheezes. Heart:  Regular rate and rhythm; no murmurs. Abdomen:  Soft, nontender, nondistended, normal bowel sounds, no rebound or guarding. No hepatosplenomegaly.   Rectal:  Deferred  Msk:   Symmetrical. No boney deformities LAD: No inguinal or umbilical LAD Extremities:  No clubbing or edema. Neurologic:  Alert and  oriented x4;  grossly nonfocal Skin:  Intact without significant lesions or rashes. Psych:  Alert and cooperative. Normal mood and affect.   Tayllor Breitenstein L. Tarri Glenn, MD, MPH 11/11/2021, 11:00 AM

## 2021-12-23 HISTORY — PX: EYE SURGERY: SHX253

## 2021-12-28 ENCOUNTER — Encounter: Payer: Self-pay | Admitting: Pulmonary Disease

## 2021-12-28 ENCOUNTER — Ambulatory Visit (INDEPENDENT_AMBULATORY_CARE_PROVIDER_SITE_OTHER): Payer: BC Managed Care – PPO | Admitting: Pulmonary Disease

## 2021-12-28 ENCOUNTER — Encounter: Payer: Self-pay | Admitting: Gastroenterology

## 2021-12-28 ENCOUNTER — Ambulatory Visit: Payer: BC Managed Care – PPO | Admitting: Pulmonary Disease

## 2021-12-28 VITALS — BP 130/68 | HR 74 | Ht 65.7 in | Wt 212.8 lb

## 2021-12-28 DIAGNOSIS — E873 Alkalosis: Secondary | ICD-10-CM

## 2021-12-28 DIAGNOSIS — R0609 Other forms of dyspnea: Secondary | ICD-10-CM

## 2021-12-28 LAB — PULMONARY FUNCTION TEST
DL/VA % pred: 98 %
DL/VA: 4.08 ml/min/mmHg/L
DLCO cor % pred: 82 %
DLCO cor: 17.58 ml/min/mmHg
DLCO unc % pred: 82 %
DLCO unc: 17.58 ml/min/mmHg
FEF 25-75 Post: 1.68 L/sec
FEF 25-75 Pre: 1.8 L/sec
FEF2575-%Change-Post: -6 %
FEF2575-%Pred-Post: 71 %
FEF2575-%Pred-Pre: 76 %
FEV1-%Change-Post: 0 %
FEV1-%Pred-Post: 74 %
FEV1-%Pred-Pre: 75 %
FEV1-Post: 2 L
FEV1-Pre: 2.02 L
FEV1FVC-%Change-Post: 0 %
FEV1FVC-%Pred-Pre: 101 %
FEV6-%Change-Post: -1 %
FEV6-%Pred-Post: 75 %
FEV6-%Pred-Pre: 76 %
FEV6-Post: 2.54 L
FEV6-Pre: 2.57 L
FEV6FVC-%Change-Post: 0 %
FEV6FVC-%Pred-Post: 103 %
FEV6FVC-%Pred-Pre: 103 %
FVC-%Change-Post: -1 %
FVC-%Pred-Post: 73 %
FVC-%Pred-Pre: 73 %
FVC-Post: 2.54 L
FVC-Pre: 2.57 L
Post FEV1/FVC ratio: 79 %
Post FEV6/FVC ratio: 100 %
Pre FEV1/FVC ratio: 79 %
Pre FEV6/FVC Ratio: 100 %
RV % pred: 91 %
RV: 1.97 L
TLC % pred: 87 %
TLC: 4.67 L

## 2021-12-28 MED ORDER — STIOLTO RESPIMAT 2.5-2.5 MCG/ACT IN AERS: 2.0000 | INHALATION_SPRAY | Freq: Every day | RESPIRATORY_TRACT | 0 refills | Status: DC

## 2021-12-28 NOTE — Addendum Note (Signed)
Addended by: Retia Passe on: 12/28/2021 02:35 PM   Modules accepted: Orders

## 2021-12-28 NOTE — Progress Notes (Signed)
PFT done today. 

## 2021-12-28 NOTE — Patient Instructions (Signed)
Nice to see you again  Your pulmonary function test today are essentially normal.  No explanation for the elevated bicarbonate on blood work.  Overall, the results are very reassuring.  Once the sleep test is done, if you have not heard from me in a few days please send me a message and let me know.  Sometimes the results do not come to my inbox.  For the nasal congestion, it is okay to increase your Flonase to twice a day for a week or 2 to see if this helps at night.  If that not making significant improvement, you can use sinus rinses.  There is a brand called NeilMed.  You can buy it over-the-counter in the cold and flu.  Is just a squirt bottle.  You mix it with salt packets that should be right by the bottle.  Mix salt packets with distilled or purified water and flush out each nostril once or twice a day.  Check your oxygen at rest and during and after walks or exercise.  I would expect her oxygen to increase a bit with exercise compared to at rest.  If it does this, this is reassuring.  If not we will order the oxygen drops, let me know and we can do further evaluation.  Return to clinic as needed based on results of the sleep test

## 2021-12-28 NOTE — Progress Notes (Signed)
$'@Patient'I$  ID: Leah Todd, female    DOB: [birth year], 61 y.o.   MRN: [id number]  Chief Complaint  Patient presents with   Follow-up    Pt is here for follow up for DOE. Pt had full pfts done this office visit. I have to check on home sleep test that was ordered in October. No inhalers noted at this moment.     Referring provider: Ronita Hipps, MD  HPI:   61 y.o. woman whom are seeing in follow-up for evaluation of elevated bicarbonate, presumed metabolic alkalosis.    Overall doing fine.  Minimal dyspnea.  Exercising a bit more.  Walking more.  States that her oxygen saturations at 91%.  She is concerned about this.  PFTs performed today.  See below for full dictation but largely normal PFTs.  Home sleep study not yet scheduled.  HPI initial visit: Patient had lab work.  This revealed elevated CO2 low 30s.  She denies any significant dyspnea.  Mild exertional dyspnea she feels is normal.  No shortness of breath at rest.  She denies any history of sleep disordered breathing.  No issues sleeping.  Does not wake up at night etc.  No cough.  Reviewed etiologies of elevated bicarbonate for metabolic alkalosis.  We discussed that this often is from chronic respiratory acidosis.  In that work-up would revolve around trying to identify a possible respiratory acidosis or cause of respiratory acidosis.  PMH: Hypertension, hyperlipidemia Surgical history: Appendectomy, C-section, hernia repair, tonsillectomy, VATS Social history: Former smoker, lives in [place] / Pulmonary Flowsheets:   ACT:      No data to display           MMRC:     No data to display           Epworth:      No data to display           Tests:   FENO:  No results found for: "NITRICOXIDE"  PFT:    Latest Ref Rng & Units 12/28/2021    9:59 AM  PFT Results  FVC-Pre L 2.57  P  FVC-Predicted Pre % 73  P  FVC-Post L 2.54  P  FVC-Predicted Post % 73  P  Pre FEV1/FVC % % 79   P  Post FEV1/FCV % % 79  P  FEV1-Pre L 2.02  P  FEV1-Predicted Pre % 75  P  FEV1-Post L 2.00  P  DLCO uncorrected ml/min/mmHg 17.58  P  DLCO UNC% % 82  P  DLCO corrected ml/min/mmHg 17.58  P  DLCO COR %Predicted % 82  P  DLVA Predicted % 98  P  TLC L 4.67  P  TLC % Predicted % 87  P  RV % Predicted % 91  P    P Preliminary result   Personally reviewed interpreted spirometry suggestive of mild restriction versus air trapping.  No bronchodilator response.  Lung volumes within normal limits.  DLCO within normal limits.  WALK:      No data to display           Imaging: Personally reviewed No results found.  Lab Results: Personally reviewed CBC No results found for: "WBC", "RBC", "HGB", "HCT", "PLT", "MCV", "MCH", "MCHC", "RDW", "LYMPHSABS", "MONOABS", "EOSABS", "BASOSABS"  BMET No results found for: "NA", "K", "CL", "CO2", "GLUCOSE", "BUN", "CREATININE", "CALCIUM", "GFRNONAA", "GFRAA"  BNP No results found for: "BNP"  ProBNP No results found for: "PROBNP"  Specialty Problems  None   Allergies  Allergen Reactions   Codeine    Erythromycin    Penicillins    Tetracyclines & Related      There is no immunization history on file for this patient.  Past Medical History:  Diagnosis Date   Actinic keratosis    Bloating    Bone disorder    Cancer (HCC)    Conjunctivitis unspecified    Constipation    Elevated cholesterol    History of maternal deep vein thrombosis (DVT) 11/26/2015   Hypertension    Melanoma in situ ([place])    Obesity    Pneumonia     Tobacco History: Social History   Tobacco Use  Smoking Status Former  Smokeless Tobacco Not on file   Counseling given: Not Answered   Continue to not smoke  Outpatient Encounter Medications as of 12/28/2021  Medication Sig   aspirin EC 81 MG tablet Take 81 mg by mouth daily.   atorvastatin (LIPITOR) 10 MG tablet Take 10 mg by mouth daily.   Calcium Carbonate-Vitamin D (OYSTER SHELL CALCIUM/D)  500-5 MG-MCG TABS Take 1 tablet by mouth daily.   hydrochlorothiazide (HYDRODIURIL) 12.5 MG tablet Take 12.5 mg by mouth daily.   hydrochlorothiazide (MICROZIDE) 12.5 MG capsule Take 12.5 mg by mouth as needed.   Multiple Vitamin (MULTIVITAMIN) tablet Take 1 tablet by mouth daily.   NON FORMULARY Pt taking 1 Golo a day   Probiotic Product (PROBIOTIC PO) Take by mouth.   [DISCONTINUED] b complex vitamins capsule Take 1 capsule by mouth daily. (Patient not taking: Reported on 11/11/2021)   No facility-administered encounter medications on file as of 12/28/2021.     Review of Systems  Review of Systems  N/a Physical Exam  Pulse 74   Ht 5' 5.7" (1.669 m)   Wt 212 lb 12.8 oz (96.5 kg)   SpO2 91%   BMI 34.66 kg/m   Wt Readings from Last 5 Encounters:  12/28/21 212 lb 12.8 oz (96.5 kg)  11/11/21 215 lb (97.5 kg)  10/20/21 215 lb 9.6 oz (97.8 kg)  11/25/15 211 lb 12.8 oz (96.1 kg)  11/13/12 203 lb (92.1 kg)    BMI Readings from Last 5 Encounters:  12/28/21 34.66 kg/m  11/11/21 34.70 kg/m  10/20/21 34.80 kg/m  11/25/15 33.88 kg/m  11/13/12 32.47 kg/m     Physical Exam General: Sitting in chair, no acute distress Eyes: EOMI, no icterus Neck: Supple, no JVP Pulmonary: Clear, normal work of breathing Cardiovascular: Warm, no edema Abdomen: Nondistended, bowel sounds present MSK: No synovitis, no joint effusion Neuro: Normal gait, no weakness Psych: Normal mood, full affect   Assessment & Plan:   Elevated Bicarbonate on routine blood tests/metabolic alkalosis: Unclear etiology.  Presumably compensation for respiratory acidosis.  Highest suspicion of OHS/atelectasis due to habitus, weight gain. Possible OSA.  PFTs 12/2021 within normal notes, no obstruction, etc. to explain possible hypercapnia.  Still waiting for home sleep test.  If normal, no further follow-up needed.  Low oxygen: Not low enough to the oxygen.  Notes oxygen saturation low 90s.  Discussed likely  related to atelectasis in the setting of elevated hemidiaphragm habitus etc.  Encouraged to check oxygen at rest and with exertion.  If oxygen increases with exertion this would be reassuring most likely related to atelectasis.  If oxygen goes down with exertion or does not improve with exertion, additional follow-up, imaging can be considered.  Pulmonary function test suggest there is no parenchymal reason to drop oxygen given  normal DLCO.  Abnormal CT scan: CT scan 2014 with left hemidiaphragm elevation with rounded appearing left-sided pleural change.  This is likely postprocedural following VATS for procedural hemothorax.  No further follow-up needed.  Return if symptoms worsen or fail to improve.   Lanier Clam, MD 12/28/2021

## 2022-01-05 ENCOUNTER — Encounter: Payer: Self-pay | Admitting: Gastroenterology

## 2022-01-05 ENCOUNTER — Ambulatory Visit (AMBULATORY_SURGERY_CENTER): Payer: BC Managed Care – PPO | Admitting: Gastroenterology

## 2022-01-05 VITALS — BP 106/77 | HR 56 | Temp 98.0°F | Resp 15 | Ht 66.0 in | Wt 215.0 lb

## 2022-01-05 DIAGNOSIS — Z800 Family history of malignant neoplasm of digestive organs: Secondary | ICD-10-CM

## 2022-01-05 DIAGNOSIS — D123 Benign neoplasm of transverse colon: Secondary | ICD-10-CM

## 2022-01-05 DIAGNOSIS — Z1211 Encounter for screening for malignant neoplasm of colon: Secondary | ICD-10-CM

## 2022-01-05 DIAGNOSIS — K635 Polyp of colon: Secondary | ICD-10-CM

## 2022-01-05 MED ORDER — SODIUM CHLORIDE 0.9 % IV SOLN: 500.0000 mL | Freq: Once | INTRAVENOUS | Status: DC

## 2022-01-05 NOTE — Progress Notes (Signed)
Report to PACU, RN, vss, BBS= Clear.  

## 2022-01-05 NOTE — Progress Notes (Signed)
Called to room to assist during endoscopic procedure.  Patient ID and intended procedure confirmed with present staff. Received instructions for my participation in the procedure from the performing physician.  

## 2022-01-05 NOTE — Patient Instructions (Signed)
Thank you for letting us take care of your healthcare needs today. Please see handouts given to you on polyps and Hemorrhoids.    YOU HAD AN ENDOSCOPIC PROCEDURE TODAY AT [facility]:   Refer to the procedure report that was given to you for any specific questions about what was found during the examination.  If the procedure report does not answer your questions, please call your gastroenterologist to clarify.  If you requested that your care partner not be given the details of your procedure findings, then the procedure report has been included in a sealed envelope for you to review at your convenience later.  YOU SHOULD EXPECT: Some feelings of bloating in the abdomen. Passage of more gas than usual.  Walking can help get rid of the air that was put into your GI tract during the procedure and reduce the bloating. If you had a lower endoscopy (such as a colonoscopy or flexible sigmoidoscopy) you may notice spotting of blood in your stool or on the toilet paper. If you underwent a bowel prep for your procedure, you may not have a normal bowel movement for a few days.  Please Note:  You might notice some irritation and congestion in your nose or some drainage.  This is from the oxygen used during your procedure.  There is no need for concern and it should clear up in a day or so.  SYMPTOMS TO REPORT IMMEDIATELY:  Following lower endoscopy (colonoscopy or flexible sigmoidoscopy):  Excessive amounts of blood in the stool  Significant tenderness or worsening of abdominal pains  Swelling of the abdomen that is new, acute  Fever of 100F or higher   For urgent or emergent issues, a gastroenterologist can be reached at any hour by calling [phone]. Do not use MyChart messaging for urgent concerns.    DIET:  We do recommend a small meal at first, but then you may proceed to your regular diet.  Drink plenty of fluids but you should avoid alcoholic beverages for 24  hours.  ACTIVITY:  You should plan to take it easy for the rest of today and you should NOT DRIVE or use heavy machinery until tomorrow (because of the sedation medicines used during the test).    FOLLOW UP: Our staff will call the number listed on your records the next business day following your procedure.  We will call around 7:15- 8:00 am to check on you and address any questions or concerns that you may have regarding the information given to you following your procedure. If we do not reach you, we will leave a message.     If any biopsies were taken you will be contacted by phone or by letter within the next 1-3 weeks.  Please call us at [phone] if you have not heard about the biopsies in 3 weeks.    SIGNATURES/CONFIDENTIALITY: You and/or your care partner have signed paperwork which will be entered into your electronic medical record.  These signatures attest to the fact that that the information above on your After Visit Summary has been reviewed and is understood.  Full responsibility of the confidentiality of this discharge information lies with you and/or your care-partner.

## 2022-01-05 NOTE — Op Note (Signed)
[facility] Patient Name: Leah Todd Procedure Date: 01/05/2022 9:49 AM MRN: [id number] Endoscopist: Thornton Park MD, MD, [id number] Age: 61 Referring MD:  Date of Birth: 01/15/1961 Gender: Female Account #: [id number] Procedure:                Colonoscopy Indications:              Screening for colorectal malignant neoplasm                           last colonoscopy >10 years ago in [place]                           Brother with colon polyps Medicines:                Monitored Anesthesia Care Procedure:                Pre-Anesthesia Assessment:                           - Prior to the procedure, a History and Physical                            was performed, and patient medications and                            allergies were reviewed. The patient's tolerance of                            previous anesthesia was also reviewed. The risks                            and benefits of the procedure and the sedation                            options and risks were discussed with the patient.                            All questions were answered, and informed consent                            was obtained. Prior Anticoagulants: The patient has                            taken no anticoagulant or antiplatelet agents. ASA                            Grade Assessment: II - A patient with mild systemic                            disease. After reviewing the risks and benefits,                            the patient was deemed in satisfactory condition to  undergo the procedure.                           After obtaining informed consent, the colonoscope                            was passed under direct vision. Throughout the                            procedure, the patient's blood pressure, pulse, and                            oxygen saturations were monitored continuously. The                            Olympus CF-HQ190L [id number]) Colonoscope  was                            introduced through the anus and advanced to the 3                            cm into the ileum. A second forward view of the                            right colon was performed. The colonoscopy was                            performed with moderate difficulty due to a                            redundant colon. Successful completion of the                            procedure was aided by applying abdominal pressure.                            The patient tolerated the procedure well. The                            quality of the bowel preparation was good. The                            terminal ileum, ileocecal valve, appendiceal                            orifice, and rectum were photographed. Scope In: 9:58:45 AM Scope Out: 10:14:26 AM Scope Withdrawal Time: 0 hours 8 minutes 38 seconds  Total Procedure Duration: 0 hours 15 minutes 41 seconds  Findings:                 The perianal and digital rectal examinations were                            normal.  A 3 mm polyp was found in the transverse colon. The                            polyp was flat. The polyp was removed with a cold                            snare. Resection and retrieval were complete.                            Estimated blood loss was minimal.                           The exam was otherwise without abnormality on                            direct and retroflexion views except for internal                            hemorrhoids. Complications:            No immediate complications. Estimated Blood Loss:     Estimated blood loss was minimal. Impression:               - One 3 mm polyp in the transverse colon, removed                            with a cold snare. Resected and retrieved.                           - The examination was otherwise normal on direct                            and retroflexion views. Recommendation:           - Patient has a contact  number available for                            emergencies. The signs and symptoms of potential                            delayed complications were discussed with the                            patient. Return to normal activities tomorrow.                            Written discharge instructions were provided to the                            patient.                           - Resume previous diet.                           - Continue present medications.                           -  Await pathology results.                           - Repeat colonoscopy date to be determined after                            pending pathology results are reviewed for                            surveillance.                           - Emerging evidence supports eating a diet of                            fruits, vegetables, grains, calcium, and yogurt                            while reducing red meat and alcohol may reduce the                            risk of colon cancer.                           - Thank you for allowing me to be involved in your                            colon cancer prevention. Thornton Park MD, MD 01/05/2022 10:21:03 AM This report has been signed electronically.

## 2022-01-05 NOTE — Progress Notes (Signed)
Referring Provider: Ronita Hipps, MD Primary Care Physician:  Ronita Hipps, MD   Indication for Colonoscopy:  Colon cancer screening   IMPRESSION:  Need for colon cancer screening Appropriate candidate for monitored anesthesia care  PLAN: Colonoscopy in the [facility] today   HPI: Leah Todd is a 61 y.o. female presents for screening colonoscopy.  Presents for colonoscopy. She had some small polyps removed on her first colonoscopy 10 years ago with Dr. Lyda Jester.  She understood that she should have another exam in 10 years.   Brother with colon polyps. No known family history of colon cancer or polyps. No family history of uterine/endometrial cancer, pancreatic cancer or gastric/stomach cancer.   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     atorvastatin (LIPITOR) 10 MG tablet Take 10 mg by mouth daily.     Calcium Carbonate-Vitamin D (OYSTER SHELL CALCIUM/D) 500-5 MG-MCG TABS Take 1 tablet by mouth daily.     cetirizine (ZYRTEC) 10 MG chewable tablet Chew 10 mg by mouth daily.     fluticasone (FLONASE) 50 MCG/ACT nasal spray Place 1 spray into both nostrils daily.     hydrochlorothiazide (HYDRODIURIL) 12.5 MG tablet Take  12.5 mg by mouth daily.     Multiple Vitamin (MULTIVITAMIN) tablet Take 1 tablet by mouth daily.     NON FORMULARY Pt taking 1 [place] a day     Probiotic Product (PROBIOTIC PO) Take by mouth.     hydrochlorothiazide (MICROZIDE) 12.5 MG capsule Take 12.5 mg by mouth as needed.     Current Facility-Administered Medications  Medication Dose Route Frequency Provider Last Rate Last Admin   0.9 %  sodium chloride infusion  500 mL Intravenous Once [place], MD        Allergies as of 01/05/2022 - Review Complete 01/05/2022  Allergen Reaction Noted   Codeine  11/12/2012   Erythromycin  11/12/2012   Penicillins  11/12/2012   Tetracyclines & related  11/12/2012    Family History  Problem Relation Age of Onset   Stroke Mother    Bone cancer Father    Heart attack Father    Hypertension Sister    Hypertension Brother    Colon polyps Brother    Stomach cancer Brother    Diabetes Maternal Grandmother    Diabetes Maternal Grandfather    Diabetes Paternal Grandmother    Colon cancer Neg Hx    Esophageal cancer Neg Hx    Rectal cancer Neg Hx      Physical  Exam: General:   Alert,  well-nourished, pleasant and cooperative in NAD Head:  Normocephalic and atraumatic. Eyes:  Sclera clear, no icterus.   Conjunctiva pink. Mouth:  No deformity or lesions.   Neck:  Supple; no masses or thyromegaly. Lungs:  Clear throughout to auscultation.   No wheezes. Heart:  Regular rate and rhythm; no murmurs. Abdomen:  Soft, non-tender, nondistended, normal bowel sounds, no rebound or guarding.  Msk:  Symmetrical. No boney deformities LAD: No inguinal or umbilical LAD Extremities:  No clubbing or edema. Neurologic:  Alert and  oriented x4;  grossly nonfocal Skin:  No obvious rash or bruise. Psych:  Alert and cooperative. Normal mood and affect.     Studies/Results: No results found.    Hadia Minier L. Tarri Glenn, MD, MPH 01/05/2022, 9:50 AM

## 2022-01-06 ENCOUNTER — Telehealth: Payer: Self-pay

## 2022-01-06 NOTE — Telephone Encounter (Signed)
  Follow up Call-     01/05/2022    9:26 AM  Call back number  Post procedure Call Back phone  # [phone]  Permission to leave phone message Yes     Patient questions:  Do you have a fever, pain , or abdominal swelling? No. Pain Score  0 *  Have you tolerated food without any problems? Yes.    Have you been able to return to your normal activities? Yes.    Do you have any questions about your discharge instructions: Diet   No. Medications  No. Follow up visit  No.  Do you have questions or concerns about your Care? No.  Actions: * If pain score is 4 or above: No action needed, pain <4.

## 2022-01-19 ENCOUNTER — Encounter: Payer: Self-pay | Admitting: Gastroenterology

## 2022-01-26 ENCOUNTER — Ambulatory Visit: Payer: BC Managed Care – PPO | Admitting: Adult Health

## 2022-01-26 DIAGNOSIS — E873 Alkalosis: Secondary | ICD-10-CM

## 2022-01-26 DIAGNOSIS — G4733 Obstructive sleep apnea (adult) (pediatric): Secondary | ICD-10-CM

## 2022-02-02 DIAGNOSIS — G4733 Obstructive sleep apnea (adult) (pediatric): Secondary | ICD-10-CM

## 2022-02-03 ENCOUNTER — Encounter: Payer: Self-pay | Admitting: Pulmonary Disease

## 2022-02-05 NOTE — Progress Notes (Signed)
Sleep test reveals sleep apnea and low oxygen at night. An in lap titration study was recommended. If she is amenable, we can order a CPAP titration study. May benefit from following up with a sleep specialist in future.

## 2022-02-14 ENCOUNTER — Telehealth: Payer: Self-pay | Admitting: Family Medicine

## 2022-02-15 ENCOUNTER — Ambulatory Visit: Payer: BC Managed Care – PPO | Admitting: Family Medicine

## 2022-02-15 NOTE — Telephone Encounter (Signed)
error 

## 2022-02-16 ENCOUNTER — Encounter: Payer: Self-pay | Admitting: Pulmonary Disease

## 2022-02-17 NOTE — Telephone Encounter (Signed)
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 So the lung does not expand normally because if this. This can cause shortness of breath.  Can we also follow up about the in lab titration sleep study - I know she expressed some hesitation.  Thanks.

## 2022-02-17 NOTE — Telephone Encounter (Signed)
Mychart message sent by pt: Leah Todd [facility] (supporting Lanier Clam, MD)11 hours ago (9:59 PM)    I was trying to remember which nerve you said I had damaged? I was looking over the medical report from my CT in 2014 and I don't see any notes of any thing being damaged. There was a note about a spot on one of my adrenal gland which I wasn't aware of. Please let me know which nerve it was and what it means. Thanks, Leah Todd     Dr. Silas Flood, please advise.

## 2022-02-20 ENCOUNTER — Ambulatory Visit: Payer: BC Managed Care – PPO | Admitting: Family Medicine

## 2022-02-24 ENCOUNTER — Ambulatory Visit (INDEPENDENT_AMBULATORY_CARE_PROVIDER_SITE_OTHER): Payer: BC Managed Care – PPO | Admitting: Family Medicine

## 2022-02-24 ENCOUNTER — Encounter: Payer: Self-pay | Admitting: Family Medicine

## 2022-02-24 VITALS — BP 144/84 | HR 75 | Temp 97.6°F | Ht 66.0 in | Wt 212.4 lb

## 2022-02-24 DIAGNOSIS — J986 Disorders of diaphragm: Secondary | ICD-10-CM | POA: Diagnosis not present

## 2022-02-24 DIAGNOSIS — M79662 Pain in left lower leg: Secondary | ICD-10-CM | POA: Insufficient documentation

## 2022-02-24 DIAGNOSIS — D447 Neoplasm of uncertain behavior of aortic body and other paraganglia: Secondary | ICD-10-CM | POA: Insufficient documentation

## 2022-02-24 MED ORDER — DICLOFENAC SODIUM 1 % EX GEL: 4.0000 g | Freq: Four times a day (QID) | CUTANEOUS | 3 refills | Status: DC | PRN

## 2022-02-24 NOTE — Progress Notes (Signed)
Assessment/Plan:   Problem List Items Addressed This Visit       Respiratory   Diaphragm paralysis    Diaphragmatic paralysis and pulmonary issues were discussed but require further evaluation by a specialist in pulmonology or cardiothoracic surgery. This was not the main focus of the current visit and needs separate attention.        Other   Pain in left lower leg - Primary    Leah Todd is experiencing left lower leg pain, particularly when walking. The pain varies in location from the shin upward to the calf and sometimes includes the foot. This pain may be compounded by a past injury at work, though it is improving with topical treatment. This may be muscular in nature or possibly related to a nerve issue but requires further investigation to rule out another deep vein thrombosis given her medical history.  Differential diagnosis:  Shin splints (tibial stress syndrome) Chronic exertional compartment syndrome Peripheral neuropathy Muscular strain Recurrence of deep vein thrombosis  Plan:  To determine the cause of Leah Todd's pain, a structured approach is needed. This includes:  Prescription for topical diclofenac to address inflammation and pain. Ultrasound to rule out deep vein thrombosis, considering her medical history and the symptoms' fluctuating nature. If no improvement, consider referral to podiatry for evaluation of possible orthotics and foot dynamics assessment, to correct any gait issues contributing to her shin pain. Recommend stretching and strengthening exercises for the ankle and leg muscles. If symptoms persist or worsen, consider referral to appropriate specialists such as a vascular surgeon and/or orthopedic surgeon. Follow-up appointment in 4-6 weeks to reassess pain and any imaging or test results.      Relevant Medications   diclofenac Sodium (VOLTAREN) 1 % GEL   Other Relevant Orders   VAS [place] LOWER EXTREMITY VENOUS (DVT)   Paraganglioma (HCC)     There are no discontinued medications.    Subjective:  HPI: Encounter date: 02/24/2022  Leah Todd is a 62 y.o. 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   She  has a past medical history of Actinic keratosis, Allergy (1980), Anemia (1984), Arthritis (2020), Bloating, Bone disorder, Cancer ([place]), Cataract (2022), Conjunctivitis unspecified, Constipation, Elevated cholesterol, History of maternal deep vein thrombosis (DVT) (11/26/2015), Hypertension, Melanoma in situ ([place]), Obesity, Pneumonia, and Sleep apnea (It is still in question, not fully diagnosed)..   She presents with chief complaint of Establish Care (Left lower leg pain x 3 months. Patient walks 5 miles a week. ) .  Problem 1: Leah Todd has been experiencing pain in her left leg, mainly in the shin area, which is worsened by walking. She has been using aspercreme to manage the pain, and it provides some relief, albeit delayed. She has a history of vascular issues in the leg, including varicose veins that have been treated by stripping, lasing, and injections.  Problem 2: Leah Todd also reported a history of diaphragmatic issues potentially leading to elevated CO2 levels in her blood and has been advised about using CPAP by a pulmonologist, which she is currently not using and is reluctant to use unless absolutely necessary. 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  REVIEW OF SYSTEMS: Positive for musculoskeletal pain in the  left lower leg; negative for respiratory distress. The remainder of the review of systems by each organ system is reported  negative.  Past Surgical History:  Procedure Laterality Date   ABDOMINAL HYSTERECTOMY  1994   [place]   EYE SURGERY  2020 & 2022   torn retina right eye/ detached retina  left eye   EYE SURGERY  12/2021   Detached retina left eye, bubble & hole, belt and freeze   HERNIA REPAIR  1988   MELANOMA EXCISION     OVARIAN CYST SURGERY  1980   RETINAL DETACHMENT SURGERY Left 12/2020   THORACOTOMY  2001   [place], 2006 & 2014    Outpatient Medications Prior to Visit  Medication Sig Dispense Refill   aspirin EC 81 MG tablet Take 81 mg by mouth daily.     atorvastatin (LIPITOR) 10 MG tablet Take 10 mg by mouth daily.     Cholecalciferol (VITAMIN D) 125 MCG (5000 UT) CAPS Take 1,000 Units by mouth daily.     hydrochlorothiazide (HYDRODIURIL) 12.5 MG tablet Take 12.5 mg by mouth daily.     Multiple Vitamin (MULTIVITAMIN) tablet Take 1 tablet by mouth daily.     NON FORMULARY Pt taking 1 Golo a day     Probiotic Product (PROBIOTIC PO) Take by mouth.     Calcium Carbonate-Vitamin D (OYSTER SHELL CALCIUM/D) 500-5 MG-MCG TABS Take 1 tablet by mouth daily. (Patient not taking: Reported on 02/24/2022)     cetirizine (ZYRTEC) 10 MG chewable tablet Chew 10 mg by mouth daily. (Patient not taking: Reported on 02/24/2022)     fluticasone (FLONASE) 50 MCG/ACT nasal spray Place 1 spray into both nostrils daily. (Patient not taking: Reported on 02/24/2022)     hydrochlorothiazide (MICROZIDE) 12.5 MG capsule Take 12.5 mg by mouth as needed.     No facility-administered medications prior to visit.    Family History  Problem Relation Age of Onset   Stroke Mother    Arthritis Mother    Obesity Mother    Varicose Veins Mother    Bone cancer Father    Heart attack Father    Cancer Father    Heart disease Father    Hypertension Sister    Hypertension Brother    Colon polyps Brother    Stomach  cancer Brother    Cancer Brother    Obesity Brother    Diabetes Maternal Grandmother    Arthritis Maternal Grandmother    Obesity Maternal Grandmother    Diabetes Maternal Grandfather    Diabetes Paternal Grandmother    Cancer Maternal Aunt    Cancer Maternal Aunt    Obesity Maternal Aunt    Obesity Son    Obesity Daughter    Varicose Veins Daughter    Colon cancer Neg Hx    Esophageal cancer Neg Hx    Rectal cancer Neg Hx     Social History   Socioeconomic History   Marital status: Married    Spouse name: Not on file   Number of children: 2   Years of education: Not on file   Highest education level: Not on file  Occupational History   Occupation: retired  Tobacco Use   Smoking status: Former   Smokeless tobacco: Not on file   Tobacco comments:    Mostly just social on and off  for all the years I smoked.  Vaping Use   Vaping Use: Never used  Substance and Sexual Activity   Alcohol use: Yes    Alcohol/week: 1.0 - 2.0 standard drink of alcohol    Types: 1 - 2 Standard drinks or equivalent per week    Comment: occasional   Drug use: No   Sexual activity: Yes    Birth control/protection: Other-see comments, None  Other Topics Concern   Not on file  Social History Narrative   Not on file   Social Determinants of Health   Financial Resource Strain: Not on file  Food Insecurity: Not on file  Transportation Needs: Not on file  Physical Activity: Not on file  Stress: Not on file  Social Connections: Not on file  Intimate Partner Violence: Not on file                                                                                                 Objective:  Physical Exam: BP (!) 144/84 (BP Location: Left Arm, Patient Position: Sitting, Cuff Size: Large)   Pulse 75   Temp 97.6 F (36.4 C) (Temporal)   Ht '5\' 6"'$  (1.676 m)   Wt 212 lb 6.4 oz (96.3 kg)   SpO2 97%   BMI 34.28 kg/m    General: No acute distress. Awake and conversant.  Eyes: Normal  conjunctiva, anicteric. Round symmetric pupils.  ENT: Hearing grossly intact. No nasal discharge.  Neck: Neck is supple. No masses or thyromegaly.  Respiratory: Respirations are non-labored. No auditory wheezing.  Skin: Warm. No rashes or ulcers.  Psych: Alert and oriented. Cooperative, Appropriate mood and affect, Normal judgment.  CV: No cyanosis or JVD MSK: No lower extremity edema, left calf nontender no redness Neuro: Sensation and CN II-XII grossly normal.        Alesia Banda, MD, MS

## 2022-02-24 NOTE — Assessment & Plan Note (Addendum)
Leah Todd is experiencing left lower leg pain, particularly when walking. The pain varies in location from the shin upward to the calf and sometimes includes the foot. This pain may be compounded by a past injury at work, though it is improving with topical treatment. This may be muscular in nature or possibly related to a nerve issue but requires further investigation to rule out another deep vein thrombosis given her medical history.  Differential diagnosis:  Shin splints (tibial stress syndrome) Chronic exertional compartment syndrome Peripheral neuropathy Muscular strain Recurrence of deep vein thrombosis  Plan:  To determine the cause of Leah Todd's pain, a structured approach is needed. This includes:  Prescription for topical diclofenac to address inflammation and pain. Ultrasound to rule out deep vein thrombosis, considering her medical history and the symptoms' fluctuating nature. If no improvement, consider referral to podiatry for evaluation of possible orthotics and foot dynamics assessment, to correct any gait issues contributing to her shin pain. Recommend stretching and strengthening exercises for the ankle and leg muscles. If symptoms persist or worsen, consider referral to appropriate specialists such as a vascular surgeon and/or orthopedic surgeon. Follow-up appointment in 4-6 weeks to reassess pain and any imaging or test results.

## 2022-02-24 NOTE — Assessment & Plan Note (Signed)
Diaphragmatic paralysis and pulmonary issues were discussed but require further evaluation by a specialist in pulmonology or cardiothoracic surgery. This was not the main focus of the current visit and needs separate attention.

## 2022-02-24 NOTE — Patient Instructions (Signed)
Try diclofenac for pain and exercies. We are ordering an ultrasound and office will call to schedule.

## 2022-03-06 ENCOUNTER — Telehealth: Payer: Self-pay | Admitting: Family Medicine

## 2022-03-06 ENCOUNTER — Telehealth: Payer: Self-pay

## 2022-03-06 DIAGNOSIS — M79662 Pain in left lower leg: Secondary | ICD-10-CM

## 2022-03-06 MED ORDER — DICLOFENAC SODIUM 1 % EX GEL: 4.0000 g | Freq: Four times a day (QID) | CUTANEOUS | 3 refills | Status: DC | PRN

## 2022-03-06 NOTE — Telephone Encounter (Signed)
A user error has taken place: encounter opened in error, closed for administrative reasons.

## 2022-03-06 NOTE — Telephone Encounter (Signed)
[facility]  Does not have pt's diclofenac Sodium (VOLTAREN) 1 % GEL [id number]. They are out of it.  She would like this script sent to [facility] Address: [address] [place], [place] [address] Phone: [phone]  Pt's # [phone]

## 2022-03-06 NOTE — Telephone Encounter (Signed)
Rx sent to [facility].Left patient a detailed voice message advising that medication was resent to pharmacy below.

## 2022-03-09 ENCOUNTER — Encounter: Payer: Self-pay | Admitting: Family Medicine

## 2022-03-09 DIAGNOSIS — M899 Disorder of bone, unspecified: Secondary | ICD-10-CM

## 2022-03-09 DIAGNOSIS — D447 Neoplasm of uncertain behavior of aortic body and other paraganglia: Secondary | ICD-10-CM

## 2022-03-09 DIAGNOSIS — J986 Disorders of diaphragm: Secondary | ICD-10-CM

## 2022-03-09 DIAGNOSIS — E669 Obesity, unspecified: Secondary | ICD-10-CM

## 2022-03-09 DIAGNOSIS — E7800 Pure hypercholesterolemia, unspecified: Secondary | ICD-10-CM

## 2022-03-09 DIAGNOSIS — Z86718 Personal history of other venous thrombosis and embolism: Secondary | ICD-10-CM

## 2022-03-16 ENCOUNTER — Telehealth: Payer: Self-pay | Admitting: Family Medicine

## 2022-03-16 NOTE — Telephone Encounter (Signed)
Pt has appt on Monday 2/26 for a pain near her kidney and would like to have it tested tomorrow during her labs. Please put in the order.

## 2022-03-16 NOTE — Telephone Encounter (Signed)
Patient is aware of annotation below and verbalized understanding. She states that if her symptoms worsen she will visit an ED or UC over the weekend, but she states that she is fine for now

## 2022-03-17 ENCOUNTER — Other Ambulatory Visit (INDEPENDENT_AMBULATORY_CARE_PROVIDER_SITE_OTHER): Payer: BC Managed Care – PPO

## 2022-03-17 DIAGNOSIS — M899 Disorder of bone, unspecified: Secondary | ICD-10-CM

## 2022-03-17 DIAGNOSIS — D447 Neoplasm of uncertain behavior of aortic body and other paraganglia: Secondary | ICD-10-CM

## 2022-03-17 DIAGNOSIS — E669 Obesity, unspecified: Secondary | ICD-10-CM | POA: Diagnosis not present

## 2022-03-17 DIAGNOSIS — E7800 Pure hypercholesterolemia, unspecified: Secondary | ICD-10-CM | POA: Diagnosis not present

## 2022-03-17 DIAGNOSIS — Z8759 Personal history of other complications of pregnancy, childbirth and the puerperium: Secondary | ICD-10-CM | POA: Diagnosis not present

## 2022-03-17 DIAGNOSIS — Z86718 Personal history of other venous thrombosis and embolism: Secondary | ICD-10-CM

## 2022-03-17 DIAGNOSIS — J986 Disorders of diaphragm: Secondary | ICD-10-CM

## 2022-03-17 LAB — CBC WITH DIFFERENTIAL/PLATELET
Basophils Absolute: 0 10*3/uL (ref 0.0–0.1)
Basophils Relative: 0.9 % (ref 0.0–3.0)
Eosinophils Absolute: 0.2 10*3/uL (ref 0.0–0.7)
Eosinophils Relative: 3.9 % (ref 0.0–5.0)
HCT: 40.4 % (ref 36.0–46.0)
Hemoglobin: 13.6 g/dL (ref 12.0–15.0)
Lymphocytes Relative: 31.1 % (ref 12.0–46.0)
Lymphs Abs: 1.3 10*3/uL (ref 0.7–4.0)
MCHC: 33.6 g/dL (ref 30.0–36.0)
MCV: 91.7 fl (ref 78.0–100.0)
Monocytes Absolute: 0.4 10*3/uL (ref 0.1–1.0)
Monocytes Relative: 8.6 % (ref 3.0–12.0)
Neutro Abs: 2.3 10*3/uL (ref 1.4–7.7)
Neutrophils Relative %: 55.5 % (ref 43.0–77.0)
Platelets: 262 10*3/uL (ref 150.0–400.0)
RBC: 4.41 Mil/uL (ref 3.87–5.11)
RDW: 13.9 % (ref 11.5–15.5)
WBC: 4.2 10*3/uL (ref 4.0–10.5)

## 2022-03-17 LAB — MICROALBUMIN / CREATININE URINE RATIO
Creatinine,U: 44.1 mg/dL
Microalb Creat Ratio: 1.6 mg/g (ref 0.0–30.0)
Microalb, Ur: <0.7 mg/dL (ref 0.0–1.9)

## 2022-03-17 LAB — LIPID PANEL
Cholesterol: 173 mg/dL (ref 0–200)
HDL: 64.9 mg/dL (ref >39.00)
LDL Cholesterol: 86 mg/dL (ref 0–99)
NonHDL: 107.79
Total CHOL/HDL Ratio: 3
Triglycerides: 108 mg/dL (ref 0.0–149.0)
VLDL: 21.6 mg/dL (ref 0.0–40.0)

## 2022-03-17 LAB — COMPREHENSIVE METABOLIC PANEL
ALT: 16 U/L (ref 0–35)
AST: 21 U/L (ref 0–37)
Albumin: 4.7 g/dL (ref 3.5–5.2)
Alkaline Phosphatase: 75 U/L (ref 39–117)
BUN: 11 mg/dL (ref 6–23)
CO2: 32 mEq/L (ref 19–32)
Calcium: 9.6 mg/dL (ref 8.4–10.5)
Chloride: 98 mEq/L (ref 96–112)
Creatinine, Ser: 0.77 mg/dL (ref 0.40–1.20)
GFR: 82.1 mL/min (ref >60.00)
Glucose, Bld: 95 mg/dL (ref 70–99)
Potassium: 3.6 mEq/L (ref 3.5–5.1)
Sodium: 140 mEq/L (ref 135–145)
Total Bilirubin: 0.8 mg/dL (ref 0.2–1.2)
Total Protein: 7.2 g/dL (ref 6.0–8.3)

## 2022-03-17 LAB — B12 AND FOLATE PANEL
Folate: >23.9 ng/mL (ref >5.9)
Vitamin B-12: 833 pg/mL (ref 211–911)

## 2022-03-17 LAB — TSH: TSH: 5.72 u[IU]/mL — ABNORMAL HIGH (ref 0.35–5.50)

## 2022-03-17 LAB — HEMOGLOBIN A1C: Hgb A1c MFr Bld: 6 % (ref 4.6–6.5)

## 2022-03-17 NOTE — Progress Notes (Signed)
Pt is her for labs

## 2022-03-20 ENCOUNTER — Ambulatory Visit (INDEPENDENT_AMBULATORY_CARE_PROVIDER_SITE_OTHER)
Admission: RE | Admit: 2022-03-20 | Discharge: 2022-03-20 | Disposition: A | Discharge status: Home / Self Care | Payer: BC Managed Care – PPO | Source: Ambulatory Visit | Attending: Family Medicine | Admitting: Family Medicine

## 2022-03-20 ENCOUNTER — Encounter: Payer: Self-pay | Admitting: Family Medicine

## 2022-03-20 ENCOUNTER — Ambulatory Visit (INDEPENDENT_AMBULATORY_CARE_PROVIDER_SITE_OTHER): Payer: BC Managed Care – PPO | Admitting: Family Medicine

## 2022-03-20 VITALS — BP 136/78 | HR 71 | Temp 97.6°F | Wt 208.2 lb

## 2022-03-20 DIAGNOSIS — E785 Hyperlipidemia, unspecified: Secondary | ICD-10-CM | POA: Diagnosis not present

## 2022-03-20 DIAGNOSIS — R109 Unspecified abdominal pain: Secondary | ICD-10-CM | POA: Diagnosis not present

## 2022-03-20 DIAGNOSIS — E038 Other specified hypothyroidism: Secondary | ICD-10-CM | POA: Diagnosis not present

## 2022-03-20 DIAGNOSIS — M79662 Pain in left lower leg: Secondary | ICD-10-CM

## 2022-03-20 DIAGNOSIS — E039 Hypothyroidism, unspecified: Secondary | ICD-10-CM | POA: Insufficient documentation

## 2022-03-20 DIAGNOSIS — Z87442 Personal history of urinary calculi: Secondary | ICD-10-CM

## 2022-03-20 LAB — URINALYSIS, ROUTINE W REFLEX MICROSCOPIC
Bilirubin Urine: NEGATIVE
Hgb urine dipstick: NEGATIVE
Ketones, ur: NEGATIVE
Nitrite: NEGATIVE
RBC / HPF: NONE SEEN (ref 0–?)
Specific Gravity, Urine: <=1.005 — AB (ref 1.000–1.030)
Total Protein, Urine: NEGATIVE
Urine Glucose: NEGATIVE
Urobilinogen, UA: 0.2 (ref 0.0–1.0)
pH: 7 (ref 5.0–8.0)

## 2022-03-20 NOTE — Assessment & Plan Note (Signed)
The patient presents with right-sided back discomfort for a few weeks, potentially following a procedure in which there was a sensation of pressure or bruising. The patient denies fevers, chills, urinary symptoms, or systemic symptoms suggestive of infection or severe inflammatory process.  Differential diagnosis:  Musculoskeletal strain (most likely due to the chronicity and nature of pain) Kidney stone (less likely due to absence of colic or hematuria, does have a history of previous stone passage) Subclinical infection (possible, but less likely without systemic symptoms) Post-thoracotomy sequelae affecting rib area (less likely given time since surgery)  Plan:  Repeat urine analysis (UA) and culture to rule out infection. Obtain a CT scan of the abdomen and pelvis with a focus on kidneys, to assess for stones or other abnormalities. Consider chest X-ray to evaluate the ribs and soft tissues in the thoracic region. Advise patient to monitor symptoms and report any worsening such as fevers, chills, increased pain, or new symptoms. Continue to encourage hydration. Discuss non-pharmacological pain management strategies and consider follow-up with physical therapy if pain persists.

## 2022-03-20 NOTE — Patient Instructions (Addendum)
For flank pain, we are rechecking urine for infection. We are ordering CT, chest x-ray, to assess for possible kidney stone or rib abnormality.  Please return to clinic if abdominal pain is worsening, and we will call you for any imaging and treat pending these results.   Left lower leg pain, we reordered the ultrasound.  The office will call to coordinate these together.  Please follow-up with [place] if you have not heard back in a few days.    For low thyroid, we will recheck your labs in 6 months.

## 2022-03-20 NOTE — Assessment & Plan Note (Signed)
Mildly elevated, asymptomatic.  Recheck in 6 months.

## 2022-03-20 NOTE — Progress Notes (Signed)
Assessment/Plan:   Problem List Items Addressed This Visit       Endocrine   Subclinical hypothyroidism    Mildly elevated, asymptomatic.  Recheck in 6 months.        Other   Hyperlipidemia   Pain in left lower leg - Primary   Relevant Orders   VAS [place] LOWER EXTREMITY VENOUS (DVT)   Right flank pain    The patient presents with right-sided back discomfort for a few weeks, potentially following a procedure in which there was a sensation of pressure or bruising. The patient denies fevers, chills, urinary symptoms, or systemic symptoms suggestive of infection or severe inflammatory process.  Differential diagnosis:  Musculoskeletal strain (most likely due to the chronicity and nature of pain) Kidney stone (less likely due to absence of colic or hematuria, does have a history of previous stone passage) Subclinical infection (possible, but less likely without systemic symptoms) Post-thoracotomy sequelae affecting rib area (less likely given time since surgery)  Plan:  Repeat urine analysis (UA) and culture to rule out infection. Obtain a CT scan of the abdomen and pelvis with a focus on kidneys, to assess for stones or other abnormalities. Consider chest X-ray to evaluate the ribs and soft tissues in the thoracic region. Advise patient to monitor symptoms and report any worsening such as fevers, chills, increased pain, or new symptoms. Continue to encourage hydration. Discuss non-pharmacological pain management strategies and consider follow-up with physical therapy if pain persists.      Relevant Orders   Urinalysis, Routine w reflex microscopic   Urine Culture   CT ABDOMEN PELVIS WO CONTRAST   DG Chest 2 View   History of nephrolithiasis    There are no discontinued medications.    Subjective:  HPI: Encounter date: 03/20/2022  Leah Todd is a 62 y.o. 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   CHIEF COMPLAINT: The patient presents with right-sided back discomfort for evaluation of ongoing pain and laboratory review from 03/17/22.  HISTORY OF PRESENT ILLNESS:  Back Pain. The patient has been experiencing right-sided back discomfort noticed a couple of weeks ago, originally thought to be strain but persistence of pain led to current evaluation. The discomfort is not associated with any trauma or increased exertion. The patient reports a weird sensation on the right side, and mild tenderness in the lower right quadrant of the back. There is no history of kidney stones, but a previous urinary tract infection and possible stone passage were noted long ago. There is no associated fever, chills, nausea, vomiting, or changes in urination. A previous issue with a delicate bottom rib, initially post-thoracotomy surgery, has been tender but is not currently the main concern. The pain does not seem to be worsening; however, there is a sensation of discomfort when the area is touched or bumped.  Subclinical hypothyroidism.  Recent TSH mildly elevated at around 5.  Patient with symptoms of hypothyroidism.  Hyperlipidemia.  Well-controlled on atorvastatin  10 mg.  Hypertension.  Well-controlled on hydrochlorothiazide 12.5 mg.  ROS:  Cardiology: No chest pain or heart  palpitations. Respiratory: No shortness of breath or cough. Gastrointestinal: No nausea, vomiting, or changes in bowel habits. Genitourinary: No changes in urination, no burning or urgency. Musculoskeletal: No weakness in extremities, ambulation normal. Neurological: No dizziness or changes in sensation. Dermatological: No rashes or lesions. Psychological: No changes in mood or cognition. Endocrine: No polyuria, polydipsia, or heat/cold intolerance.  Past Surgical History:  Procedure Laterality Date   ABDOMINAL HYSTERECTOMY  1994   [place]   EYE SURGERY  2020 & 2022   torn retina right eye/ detached retina  left eye   EYE SURGERY  12/2021   Detached retina left eye, bubble & hole, belt and freeze   HERNIA REPAIR  1988   MELANOMA EXCISION     OVARIAN CYST SURGERY  1980   RETINAL DETACHMENT SURGERY Left 12/2020   THORACOTOMY  2001   [place], 2006 & 2014    Outpatient Medications Prior to Visit  Medication Sig Dispense Refill   aspirin EC 81 MG tablet Take 81 mg by mouth daily.     atorvastatin (LIPITOR) 10 MG tablet Take 10 mg by mouth daily.     hydrochlorothiazide (HYDRODIURIL) 12.5 MG tablet Take 12.5 mg by mouth daily.     Multiple Vitamin (MULTIVITAMIN) tablet Take 1 tablet by mouth daily.     NON FORMULARY Pt taking 1 Golo a day     Probiotic Product (PROBIOTIC PO) Take by mouth.     Calcium Carbonate-Vitamin D (OYSTER SHELL CALCIUM/D) 500-5 MG-MCG TABS Take 1 tablet by mouth daily. (Patient not taking: Reported on 02/24/2022)     cetirizine (ZYRTEC) 10 MG chewable tablet Chew 10 mg by mouth daily. (Patient not taking: Reported on 02/24/2022)     Cholecalciferol (VITAMIN D) 125 MCG (5000 UT) CAPS Take 1,000 Units by mouth daily.     diclofenac Sodium (VOLTAREN) 1 % GEL Apply 4 g topically 4 (four) times daily as needed. (Patient not taking: Reported on 03/20/2022)  100 g 3   fluticasone (FLONASE) 50 MCG/ACT nasal spray Place 1 spray into both nostrils daily. (Patient not taking: Reported on 02/24/2022)     hydrochlorothiazide (MICROZIDE) 12.5 MG capsule Take 12.5 mg by mouth as needed.     No facility-administered medications prior to visit.    Family History  Problem Relation Age of Onset   Stroke Mother    Arthritis Mother    Obesity Mother    Varicose Veins Mother    Bone cancer Father    Heart attack Father    Cancer Father    Heart disease Father    Hypertension Sister    Hypertension Brother    Colon polyps Brother    Stomach cancer Brother    Cancer Brother    Obesity Brother    Diabetes Maternal Grandmother    Arthritis Maternal Grandmother    Obesity Maternal Grandmother    Diabetes Maternal Grandfather    Diabetes Paternal Grandmother    Cancer Maternal Aunt    Cancer Maternal Aunt    Obesity Maternal Aunt    Obesity Son    Obesity Daughter    Varicose Veins Daughter    Colon cancer Neg Hx    Esophageal cancer Neg Hx    Rectal  cancer Neg Hx     Social History   Socioeconomic History   Marital status: Married    Spouse name: Not on file   Number of children: 2   Years of education: Not on file   Highest education level: Not on file  Occupational History   Occupation: retired  Tobacco Use   Smoking status: Former   Smokeless tobacco: Not on file   Tobacco comments:    Mostly just social on and off  for all the years I smoked.  Vaping Use   Vaping Use: Never used  Substance and Sexual Activity   Alcohol use: Yes    Alcohol/week: 1.0 - 2.0 standard drink of alcohol    Types: 1 - 2 Standard drinks or equivalent per week    Comment: occasional   Drug use: No   Sexual activity: Yes    Birth control/protection: Other-see comments, None  Other Topics Concern   Not on file  Social History Narrative   Not on file   Social Determinants of Health   Financial Resource Strain: Not on file  Food Insecurity: Not on  file  Transportation Needs: Not on file  Physical Activity: Not on file  Stress: Not on file  Social Connections: Not on file  Intimate Partner Violence: Not on file                                                                                                 Objective:  Physical Exam: BP 136/78 (BP Location: Left Arm, Patient Position: Sitting, Cuff Size: Large)   Pulse 71   Temp 97.6 F (36.4 C) (Temporal)   Wt 208 lb 3.2 oz (94.4 kg)   SpO2 99%   BMI 33.60 kg/m    Gen: NAD, resting comfortably CV: RRR with no murmurs appreciated Pulm: NWOB, CTAB with no crackles, wheezes, or rhonchi GI: Normal bowel sounds present. Soft, Nontender, Nondistended. MSK: no  cyanosis, or clubbing noted Skin: warm, dry GU: Mild right flank tenderness, remainder of abdomen nontender Neuro: grossly normal, moves all extremities Psych: Normal affect and thought content  LABS: Comprehensive metabolic panel, CBC with differential, lipid panel, TSH, intact PTH, Thyroid Panel with TSH, Testosterone (free and total), FSH/LH, B12 and Folate Panel, Microalbumin / creatinine urine ratio, Hemoglobin A1c all reviewed and pertinent findings included in the assessment and plan.  Results for orders placed or performed in visit on 03/17/22  Lipid panel  Result Value Ref Range   Cholesterol 173 0 - 200 mg/dL   Triglycerides 108.0 0.0 - 149.0 mg/dL   HDL 64.90 >39.00 mg/dL   VLDL 21.6 0.0 - 40.0 mg/dL   LDL Cholesterol 86 0 - 99 mg/dL   Total CHOL/HDL Ratio 3    NonHDL 107.79   TSH  Result Value Ref Range   TSH 5.72 (H) 0.35 - 5.50 uIU/mL  PTH, intact (no Ca)  Result Value Ref Range   PTH 34 16 - 77 pg/mL  Thyroid Panel With TSH  Result Value Ref Range   TSH 5.78 (H) 0.40 - 4.50 mIU/L  Testosterone,Free and Total  Result Value Ref Range   Testosterone 7 3 - 67 ng/dL   Testosterone, Free WILL FOLLOW   FSH/LH  Result Value Ref Range   FSH 46.6 mIU/mL   LH 25.7 mIU/mL  B12 and Folate Panel   Result Value Ref Range   Vitamin B-12 833 211 - 911 pg/mL   Folate >23.9 >5.9 ng/mL  Comprehensive metabolic panel  Result Value Ref Range   Sodium 140 135 - 145 mEq/L   Potassium 3.6 3.5 - 5.1 mEq/L   Chloride 98 96 - 112 mEq/L   CO2 32 19 - 32 mEq/L   Glucose, Bld 95 70 - 99 mg/dL   BUN 11 6 - 23 mg/dL   Creatinine, Ser 0.77 0.40 - 1.20 mg/dL   Total Bilirubin 0.8 0.2 - 1.2 mg/dL   Alkaline Phosphatase 75 39 - 117 U/L   AST 21 0 - 37 U/L   ALT 16 0 - 35 U/L   Total Protein 7.2 6.0 - 8.3 g/dL   Albumin 4.7 3.5 - 5.2 g/dL   GFR 82.10 >60.00 mL/min   Calcium 9.6 8.4 - 10.5 mg/dL  CBC with Differential/Platelet  Result Value Ref Range   WBC 4.2 4.0 - 10.5 K/uL   RBC 4.41 3.87 - 5.11 Mil/uL   Hemoglobin 13.6 12.0 - 15.0 g/dL   HCT 40.4 36.0 - 46.0 %   MCV 91.7 78.0 - 100.0 fl   MCHC 33.6 30.0 - 36.0 g/dL   RDW 13.9 11.5 - 15.5 %   Platelets 262.0 150.0 - 400.0 K/uL   Neutrophils Relative % 55.5 43.0 - 77.0 %   Lymphocytes Relative 31.1 12.0 - 46.0 %   Monocytes Relative 8.6 3.0 - 12.0 %   Eosinophils Relative 3.9 0.0 - 5.0 %   Basophils Relative 0.9 0.0 - 3.0 %   Neutro Abs 2.3 1.4 - 7.7 K/uL   Lymphs Abs 1.3 0.7 - 4.0 K/uL   Monocytes Absolute 0.4 0.1 - 1.0 K/uL   Eosinophils Absolute 0.2 0.0 - 0.7 K/uL   Basophils Absolute 0.0 0.0 - 0.1 K/uL  Microalbumin / creatinine urine ratio  Result Value Ref Range   Microalb, Ur <0.7 0.0 - 1.9 mg/dL   Creatinine,U 44.1 mg/dL   Microalb Creat Ratio 1.6 0.0 - 30.0 mg/g  Hemoglobin A1c  Result Value Ref Range   Hgb A1c MFr Bld 6.0 4.6 - 6.5 %         Alesia Banda, MD, MS

## 2022-03-21 ENCOUNTER — Encounter: Payer: Self-pay | Admitting: Family Medicine

## 2022-03-21 ENCOUNTER — Emergency Department (HOSPITAL_COMMUNITY): Payer: BC Managed Care – PPO

## 2022-03-21 ENCOUNTER — Other Ambulatory Visit: Payer: Self-pay

## 2022-03-21 ENCOUNTER — Emergency Department (HOSPITAL_COMMUNITY)
Admission: EM | Admit: 2022-03-21 | Discharge: 2022-03-21 | Disposition: A | Discharge status: Home / Self Care | Payer: BC Managed Care – PPO | Attending: Emergency Medicine | Admitting: Emergency Medicine

## 2022-03-21 DIAGNOSIS — Z7982 Long term (current) use of aspirin: Secondary | ICD-10-CM | POA: Insufficient documentation

## 2022-03-21 DIAGNOSIS — Z91040 Latex allergy status: Secondary | ICD-10-CM | POA: Insufficient documentation

## 2022-03-21 DIAGNOSIS — R109 Unspecified abdominal pain: Secondary | ICD-10-CM | POA: Diagnosis present

## 2022-03-21 LAB — URINALYSIS, MICROSCOPIC (REFLEX): Bacteria, UA: NONE SEEN

## 2022-03-21 LAB — BASIC METABOLIC PANEL
Anion gap: 10 (ref 5–15)
BUN: 13 mg/dL (ref 8–23)
CO2: 28 mmol/L (ref 22–32)
Calcium: 9.5 mg/dL (ref 8.9–10.3)
Chloride: 102 mmol/L (ref 98–111)
Creatinine, Ser: 0.86 mg/dL (ref 0.44–1.00)
GFR, Estimated: >60 mL/min (ref >60)
Glucose, Bld: 109 mg/dL — ABNORMAL HIGH (ref 70–99)
Potassium: 3.7 mmol/L (ref 3.5–5.1)
Sodium: 140 mmol/L (ref 135–145)

## 2022-03-21 LAB — CBC WITH DIFFERENTIAL/PLATELET
Abs Immature Granulocytes: 0.01 10*3/uL (ref 0.00–0.07)
Basophils Absolute: 0 10*3/uL (ref 0.0–0.1)
Basophils Relative: 1 %
Eosinophils Absolute: 0.2 10*3/uL (ref 0.0–0.5)
Eosinophils Relative: 3 %
HCT: 41.9 % (ref 36.0–46.0)
Hemoglobin: 13.8 g/dL (ref 12.0–15.0)
Immature Granulocytes: 0 %
Lymphocytes Relative: 31 %
Lymphs Abs: 1.8 10*3/uL (ref 0.7–4.0)
MCH: 30.3 pg (ref 26.0–34.0)
MCHC: 32.9 g/dL (ref 30.0–36.0)
MCV: 92.1 fL (ref 80.0–100.0)
Monocytes Absolute: 0.6 10*3/uL (ref 0.1–1.0)
Monocytes Relative: 10 %
Neutro Abs: 3.3 10*3/uL (ref 1.7–7.7)
Neutrophils Relative %: 55 %
Platelets: 286 10*3/uL (ref 150–400)
RBC: 4.55 MIL/uL (ref 3.87–5.11)
RDW: 13.3 % (ref 11.5–15.5)
WBC: 5.9 10*3/uL (ref 4.0–10.5)
nRBC: 0 % (ref 0.0–0.2)

## 2022-03-21 LAB — URINALYSIS, ROUTINE W REFLEX MICROSCOPIC
Bilirubin Urine: NEGATIVE
Glucose, UA: NEGATIVE mg/dL
Hgb urine dipstick: NEGATIVE
Ketones, ur: NEGATIVE mg/dL
Nitrite: NEGATIVE
Protein, ur: NEGATIVE mg/dL
Specific Gravity, Urine: 1.01 (ref 1.005–1.030)
pH: 7 (ref 5.0–8.0)

## 2022-03-21 MED ORDER — LIDOCAINE 5 % EX PTCH: 1.0000 | MEDICATED_PATCH | CUTANEOUS | 0 refills | Status: DC

## 2022-03-21 MED ORDER — METHOCARBAMOL 500 MG PO TABS: 500.0000 mg | ORAL_TABLET | Freq: Two times a day (BID) | ORAL | 0 refills | Status: DC

## 2022-03-21 NOTE — ED Notes (Signed)
Patient transported to CT 

## 2022-03-21 NOTE — ED Provider Triage Note (Signed)
Emergency Medicine Provider Triage Evaluation Note  Leah Todd , a 62 y.o. female  was evaluated in triage.  Patient complains of back pain.  She reports that she has had multiple weeks worth of left middle back pain and now the right side is hurting her too.  Concern for kidney stone.  No dysuria or hematuria.  Saw PCP yesterday who planned to schedule CT scan and urinalysis.  Patient preferred to come here.  No red flag symptoms  Review of Systems  Positive:  Negative:   Physical Exam  BP (!) 150/85 (BP Location: Right Arm)   Pulse 72   Temp 98.6 F (37 C) (Oral)   Resp 18   SpO2 97%  Gen:   Awake, no distress   Resp:  Normal effort  MSK:   Moves extremities without difficulty  Other:  Negative CVA bilaterally.  No midline tenderness.  Reproducible over the paraspinals of the thoracic spine.  Ambulatory  Medical Decision Making  Medically screening exam initiated at 4:02 PM.  Appropriate orders placed.  Leah Todd was informed that the remainder of the evaluation will be completed by another provider, this initial triage assessment does not replace that evaluation, and the importance of remaining in the ED until their evaluation is complete.     Leah Hammock, PA-C 03/21/22 1603

## 2022-03-21 NOTE — ED Provider Notes (Signed)
[facility] Provider Note   CSN: [id number] Arrival date & time: 03/21/22  1556     History  Chief Complaint  Patient presents with   Flank Pain    Leah Todd is a 62 y.o. female.  62 year old female presents with left-sided flank pain.  Pain has been present for 24 hours.  Is worse with movement.  Is atraumatic.  No associated urinary symptoms.  No skin changes noted.  No prior history of same.  Denies any shortness of breath or pleurisy.  No prior history of same.  No treatment use prior to arrival       Home Medications Prior to Admission medications   Medication Sig Start Date End Date Taking? Authorizing Provider  aspirin EC 81 MG tablet Take 81 mg by mouth daily.    [provider]  atorvastatin (LIPITOR) 10 MG tablet Take 10 mg by mouth daily.    [provider]  Calcium Carbonate-Vitamin D (OYSTER SHELL CALCIUM/D) 500-5 MG-MCG TABS Take 1 tablet by mouth daily. Patient not taking: Reported on 02/24/2022    [provider]  cetirizine (ZYRTEC) 10 MG chewable tablet Chew 10 mg by mouth daily. Patient not taking: Reported on 02/24/2022    [provider]  Cholecalciferol (VITAMIN D) 125 MCG (5000 UT) CAPS Take 1,000 Units by mouth daily.    [provider]  diclofenac Sodium (VOLTAREN) 1 % GEL Apply 4 g topically 4 (four) times daily as needed. Patient not taking: Reported on 03/20/2022 03/06/22   Bonnita Hollow, MD  fluticasone [facility]) 50 MCG/ACT nasal spray Place 1 spray into both nostrils daily. Patient not taking: Reported on 02/24/2022    [provider]  hydrochlorothiazide (HYDRODIURIL) 12.5 MG tablet Take 12.5 mg by mouth daily.    [provider]  hydrochlorothiazide (MICROZIDE) 12.5 MG capsule Take 12.5 mg by mouth as needed.    [provider]  Multiple Vitamin (MULTIVITAMIN) tablet Take 1 tablet by mouth daily.    [provider]  NON  FORMULARY Pt taking 1 Golo a day    [provider]  Probiotic Product (PROBIOTIC PO) Take by mouth.    [provider]      Allergies    Latex, Codeine, Erythromycin, Penicillins, and Tetracyclines & related    Review of Systems   Review of Systems  All other systems reviewed and are negative.   Physical Exam Updated Vital Signs BP (!) 150/97   Pulse 73   Temp 98.6 F (37 C) (Oral)   Resp 18   SpO2 97%  Physical Exam Vitals and nursing note reviewed.  Constitutional:      General: She is not in acute distress.    Appearance: Normal appearance. She is well-developed. She is not toxic-appearing.  HENT:     Head: Normocephalic and atraumatic.  Eyes:     General: Lids are normal.     Conjunctiva/sclera: Conjunctivae normal.     Pupils: Pupils are equal, round, and reactive to light.  Neck:     Thyroid: No thyroid mass.     Trachea: No tracheal deviation.  Cardiovascular:     Rate and Rhythm: Normal rate and regular rhythm.     Heart sounds: Normal heart sounds. No murmur heard.    No gallop.  Pulmonary:     Effort: Pulmonary effort is normal. No respiratory distress.     Breath sounds: Normal breath sounds. No stridor. No decreased breath  sounds, wheezing, rhonchi or rales.  Abdominal:     General: There is no distension.     Palpations: Abdomen is soft.     Tenderness: There is no abdominal tenderness. There is no rebound.  Musculoskeletal:        General: No tenderness. Normal range of motion.     Cervical back: Normal range of motion and neck supple.       Back:  Skin:    General: Skin is warm and dry.     Findings: No abrasion or rash.  Neurological:     Mental Status: She is alert and oriented to person, place, and time. Mental status is at baseline.     GCS: GCS eye subscore is 4. GCS verbal subscore is 5. GCS motor subscore is 6.     Cranial Nerves: No cranial nerve deficit.     Sensory: No sensory deficit.     Motor: Motor function is  intact.  Psychiatric:        Attention and Perception: Attention normal.        Speech: Speech normal.        Behavior: Behavior normal.     ED Results / Procedures / Treatments   Labs (all labs ordered are listed, but only abnormal results are displayed) Labs Reviewed  BASIC METABOLIC PANEL - Abnormal; Notable for the following components:      Result Value   Glucose, Bld 109 (*)    All other components within normal limits  URINALYSIS, ROUTINE W REFLEX MICROSCOPIC - Abnormal; Notable for the following components:   Leukocytes,Ua TRACE (*)    All other components within normal limits  CBC WITH DIFFERENTIAL/PLATELET  URINALYSIS, MICROSCOPIC (REFLEX)    EKG None  Radiology CT Renal Stone Study  Result Date: 03/21/2022 CLINICAL DATA:  Bilateral flank pain EXAM: CT ABDOMEN AND PELVIS WITHOUT CONTRAST TECHNIQUE: Multidetector CT imaging of the abdomen and pelvis was performed following the standard protocol without IV contrast. RADIATION DOSE REDUCTION: This exam was performed according to the departmental dose-optimization program which includes automated exposure control, adjustment of the mA and/or kV according to patient size and/or use of iterative reconstruction technique. COMPARISON:  11/15/2012 FINDINGS: Lower chest: Mild elevation of the left hand are of Manus Gunning with associated left basilar atelectasis or scarring. No acute abnormality. Hepatobiliary: No focal liver abnormality is seen. No gallstones, gallbladder wall thickening, or biliary dilatation. Pancreas: Unremarkable Spleen: Unremarkable Adrenals/Urinary Tract: 1.5 x 1.9 cm left adrenal nodule is identified and is stable since remote prior examination of 11/15/2012 and is safely considered benign, likely an adrenal adenoma. Right adrenal gland is unremarkable. The kidneys are normal in size and position. 1-2 mm punctate nonobstructing calculus is seen within the lower pole the right kidney. The kidneys are otherwise  unremarkable. The bladder is unremarkable. Stomach/Bowel: Stomach, small bowel, and large bowel are unremarkable. Appendix absent. No free intraperitoneal gas or fluid. Vascular/Lymphatic: Aortic atherosclerosis. No enlarged abdominal or pelvic lymph nodes. Reproductive: Status post hysterectomy. No adnexal masses. Other: Tiny fat containing umbilical hernia. No abdominopelvic ascites. Musculoskeletal: Degenerative changes are seen within the lumbar spine. No acute bone abnormality. No lytic or blastic bone lesion. IMPRESSION: 1. No acute intra-abdominal pathology identified. No definite radiographic explanation for the patient's reported symptoms. 2. Minimal right nonobstructing nephrolithiasis. No urolithiasis. No hydronephrosis. 3. 1.9 cm left adrenal nodule, stable since remote prior examination of 11/15/2012 and safely considered benign, likely an adrenal adenoma. No follow-up imaging is recommended for this lesion. 4.  Aortic atherosclerosis. Aortic Atherosclerosis (ICD10-I70.0). Electronically Signed   By: Fidela Salisbury M.D.   On: 03/21/2022 17:53    Procedures Procedures    Medications Ordered in ED Medications - No data to display  ED Course/ Medical Decision Making/ A&P                             Medical Decision Making  Patient is urinalysis negative for infection.  Concern for possible renal colic and CT negative for kidney stone.  As well per my interpretation.  But musculoskeletal etiology.  Will place on lidocaine patch as well as muscle relaxants and return precautions given.        Final Clinical Impression(s) / ED Diagnoses Final diagnoses:  None    Rx / DC Orders ED Discharge Orders     None         Lacretia Leigh, MD 03/21/22 1840

## 2022-03-21 NOTE — ED Notes (Signed)
Patient verbalizes understanding of discharge instructions. Opportunity for questioning and answers were provided. Pt discharged from ED. 

## 2022-03-21 NOTE — ED Triage Notes (Signed)
Pt with L flank pain x several weeks and R flank pain that started today. Denies dysuria or hematuria.

## 2022-03-21 NOTE — Telephone Encounter (Signed)
Patient is aware of annotation below and verbalized understanding.  

## 2022-03-22 LAB — URINE CULTURE
MICRO NUMBER:: 14614180
Result:: NO GROWTH
SPECIMEN QUALITY:: ADEQUATE

## 2022-03-23 ENCOUNTER — Encounter: Payer: Self-pay | Admitting: Pulmonary Disease

## 2022-03-23 ENCOUNTER — Ambulatory Visit (INDEPENDENT_AMBULATORY_CARE_PROVIDER_SITE_OTHER): Payer: BC Managed Care – PPO | Admitting: Pulmonary Disease

## 2022-03-23 VITALS — BP 140/88 | HR 74 | Temp 98.0°F | Ht 66.0 in | Wt 208.8 lb

## 2022-03-23 DIAGNOSIS — G4733 Obstructive sleep apnea (adult) (pediatric): Secondary | ICD-10-CM

## 2022-03-23 LAB — THYROID PANEL WITH TSH
Free Thyroxine Index: 1.7 (ref 1.4–3.8)
T3 Uptake: 27 % (ref 22–35)
T4, Total: 6.4 ug/dL (ref 5.1–11.9)
TSH: 5.78 mIU/L — ABNORMAL HIGH (ref 0.40–4.50)

## 2022-03-23 LAB — VITAMIN D 1,25 DIHYDROXY
Vitamin D 1, 25 (OH)2 Total: 35 pg/mL (ref 18–72)
Vitamin D2 1, 25 (OH)2: <8 pg/mL
Vitamin D3 1, 25 (OH)2: 35 pg/mL

## 2022-03-23 LAB — ESTROGENS, TOTAL: Estrogen: 110 pg/mL

## 2022-03-23 LAB — PARATHYROID HORMONE, INTACT (NO CA): PTH: 34 pg/mL (ref 16–77)

## 2022-03-23 LAB — FSH/LH
FSH: 46.6 m[IU]/mL
LH: 25.7 m[IU]/mL

## 2022-03-23 LAB — TESTOSTERONE,FREE AND TOTAL
Testosterone, Free: 0.7 pg/mL (ref 0.0–4.2)
Testosterone: 7 ng/dL (ref 3–67)

## 2022-03-23 NOTE — Progress Notes (Signed)
$'@Patient'N$  ID: Leah Todd, female    DOB: 04-17-1960, 62 y.o.   MRN: [id number]  Chief Complaint  Patient presents with   Follow-up    No c/o     Referring provider: Bonnita Hollow, MD  HPI:   62 y.o. woman whom are seeing in follow-up for evaluation of elevated bicarbonate, presumed metabolic alkalosis.    Overall doing fine.  Minimal dyspnea.  Exercising well.  We discussed results of sleep test at home.  Mild sleep apnea with significant desaturation suspicious for OHS.  We discussed at length the implication of this in terms of reasons he was sent here, elevated bicarbonate likely etiology of this, as well as implications for her health in the future.  Recommend CPAP therapy.  She refuses to not wear mask.  Recommend oxygen therapy at least.  She refuses is not on oxygen.  Discussed referral to dentistry for oral appliance which she is amenable to pursuing.  Discussed at length likely diaphragm elevation.  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  Discussed referral to thoracic surgeon.  Given her lack of exercise issues, her good exercise tolerance, no role relief for additional diagnostic testing or referral to surgery for further evaluation.  HPI initial visit: Patient had lab work.  This revealed elevated CO2 low 30s.  She denies any significant dyspnea.  Mild exertional dyspnea she feels is normal.  No shortness of breath at rest.  She denies any history of sleep disordered breathing.  No issues sleeping.  Does not wake up at night etc.  No cough.  Reviewed etiologies of elevated bicarbonate for metabolic alkalosis.  We discussed that this often is from chronic respiratory acidosis.  In that work-up would revolve around trying to identify a possible respiratory acidosis or cause of respiratory acidosis.  PMH: Hypertension, hyperlipidemia Surgical history: Appendectomy, C-section, hernia repair, tonsillectomy, VATS Social history: Former smoker, lives in  [employer] / Pulmonary Flowsheets:   ACT:      No data to display           MMRC:     No data to display           Epworth:      No data to display           Tests:   FENO:  No results found for: "NITRICOXIDE"  PFT:    Latest Ref Rng & Units 12/28/2021    9:59 AM  PFT Results  FVC-Pre L 2.57   FVC-Predicted Pre % 73   FVC-Post L 2.54   FVC-Predicted Post % 73   Pre FEV1/FVC % % 79   Post FEV1/FCV % % 79   FEV1-Pre L 2.02   FEV1-Predicted Pre % 75   FEV1-Post L 2.00   DLCO uncorrected ml/min/mmHg 17.58   DLCO UNC% % 82   DLCO corrected ml/min/mmHg 17.58   DLCO COR %Predicted % 82   DLVA Predicted % 98   TLC L 4.67   TLC % Predicted % 87   RV % Predicted % 91   Personally reviewed interpreted spirometry suggestive of mild restriction versus air trapping.  No bronchodilator response.  Lung volumes within normal limits.  DLCO within normal limits.  WALK:      No data to display           Imaging: Personally reviewed DG Chest 2 View  Result Date: 03/21/2022 CLINICAL DATA:  Right flank pain and chest pain. EXAM: CHEST -  2 VIEW COMPARISON:  11/15/2012. FINDINGS: The heart size and mediastinal contours are within normal limits. There is atherosclerotic calcification of the aorta. Multiple surgical clips are noted in the superior mediastinum overlying the aortic arch. There is elevation of the left diaphragm with atelectasis or scarring at the left lung base. The right lung is clear. No pneumothorax. Bony deformity of the left ribs is unchanged. Degenerative changes are present in the thoracic spine. No acute osseous abnormality. IMPRESSION: 1. Stable atelectasis or scarring at the left lung base. 2. No acute cardiopulmonary disease. Electronically Signed   By: Brett Fairy M.D.   On: 03/21/2022 20:44   CT Renal Stone Study  Result Date: 03/21/2022 CLINICAL DATA:  Bilateral flank pain EXAM: CT ABDOMEN AND PELVIS WITHOUT CONTRAST  TECHNIQUE: Multidetector CT imaging of the abdomen and pelvis was performed following the standard protocol without IV contrast. RADIATION DOSE REDUCTION: This exam was performed according to the departmental dose-optimization program which includes automated exposure control, adjustment of the mA and/or kV according to patient size and/or use of iterative reconstruction technique. COMPARISON:  11/15/2012 FINDINGS: Lower chest: Mild elevation of the left hand are of Manus Gunning with associated left basilar atelectasis or scarring. No acute abnormality. Hepatobiliary: No focal liver abnormality is seen. No gallstones, gallbladder wall thickening, or biliary dilatation. Pancreas: Unremarkable Spleen: Unremarkable Adrenals/Urinary Tract: 1.5 x 1.9 cm left adrenal nodule is identified and is stable since remote prior examination of 11/15/2012 and is safely considered benign, likely an adrenal adenoma. Right adrenal gland is unremarkable. The kidneys are normal in size and position. 1-2 mm punctate nonobstructing calculus is seen within the lower pole the right kidney. The kidneys are otherwise unremarkable. The bladder is unremarkable. Stomach/Bowel: Stomach, small bowel, and large bowel are unremarkable. Appendix absent. No free intraperitoneal gas or fluid. Vascular/Lymphatic: Aortic atherosclerosis. No enlarged abdominal or pelvic lymph nodes. Reproductive: Status post hysterectomy. No adnexal masses. Other: Tiny fat containing umbilical hernia. No abdominopelvic ascites. Musculoskeletal: Degenerative changes are seen within the lumbar spine. No acute bone abnormality. No lytic or blastic bone lesion. IMPRESSION: 1. No acute intra-abdominal pathology identified. No definite radiographic explanation for the patient's reported symptoms. 2. Minimal right nonobstructing nephrolithiasis. No urolithiasis. No hydronephrosis. 3. 1.9 cm left adrenal nodule, stable since remote prior examination of 11/15/2012 and safely considered  benign, likely an adrenal adenoma. No follow-up imaging is recommended for this lesion. 4. Aortic atherosclerosis. Aortic Atherosclerosis (ICD10-I70.0). Electronically Signed   By: Fidela Salisbury M.D.   On: 03/21/2022 17:53    Lab Results: Personally reviewed CBC    Component Value Date/Time   WBC 5.9 03/21/2022 1611   RBC 4.55 03/21/2022 1611   HGB 13.8 03/21/2022 1611   HCT 41.9 03/21/2022 1611   PLT 286 03/21/2022 1611   MCV 92.1 03/21/2022 1611   MCH 30.3 03/21/2022 1611   MCHC 32.9 03/21/2022 1611   RDW 13.3 03/21/2022 1611   LYMPHSABS 1.8 03/21/2022 1611   MONOABS 0.6 03/21/2022 1611   EOSABS 0.2 03/21/2022 1611   BASOSABS 0.0 03/21/2022 1611    BMET    Component Value Date/Time   NA 140 03/21/2022 1611   K 3.7 03/21/2022 1611   CL 102 03/21/2022 1611   CO2 28 03/21/2022 1611   GLUCOSE 109 (H) 03/21/2022 1611   BUN 13 03/21/2022 1611   CREATININE 0.86 03/21/2022 1611   CALCIUM 9.5 03/21/2022 1611   GFRNONAA >60 03/21/2022 1611    BNP No results found for: "BNP"  ProBNP No  results found for: "PROBNP"  Specialty Problems       Pulmonary Problems   Diaphragm paralysis     Allergies  Allergen Reactions   Latex    Codeine    Erythromycin    Penicillins    Tetracyclines & Related     Immunization History  Administered Date(s) Administered   Influenza-Unspecified 01/02/2022   PFIZER(Purple Top)SARS-COV-2 Vaccination 04/04/2019, 04/28/2019, 11/26/2019, 04/22/2021   Tdap 07/10/2016   Unspecified SARS-COV-2 Vaccination 06/20/2020, 01/27/2022   Zoster Recombinat (Shingrix) 08/15/2021    Past Medical History:  Diagnosis Date   Actinic keratosis    Allergy 1980   rash from penicillin after surgery   Anemia 1984   when I was pregnant with my first child. No one ever said anything about it after that time.   Arthritis 2020   nerver really verified previous doctor said that what was wrong with my finger joint when I hurt it at work   Bloating     Bone disorder    Cancer ([place])    Cataract 2022   mild at the time   Conjunctivitis unspecified    Constipation    Elevated cholesterol    History of maternal deep vein thrombosis (DVT) 11/26/2015   Hypertension    Melanoma in situ ([place])    Obesity    Pneumonia    Sleep apnea It is still in question, not fully diagnosed    Tobacco History: Social History   Tobacco Use  Smoking Status Former  Smokeless Tobacco Not on file  Tobacco Comments   Mostly just social on and off  for all the years I smoked.   Counseling given: Not Answered Tobacco comments: Mostly just social on and off  for all the years I smoked.   Continue to not smoke  Outpatient Encounter Medications as of 03/23/2022  Medication Sig   aspirin EC 81 MG tablet Take 81 mg by mouth daily.   atorvastatin (LIPITOR) 10 MG tablet Take 10 mg by mouth daily.   hydrochlorothiazide (HYDRODIURIL) 12.5 MG tablet Take 12.5 mg by mouth daily.   lidocaine (LIDODERM) 5 % Place 1 patch onto the skin daily. Remove & Discard patch within 12 hours or as directed by MD   methocarbamol (ROBAXIN) 500 MG tablet Take 1 tablet (500 mg total) by mouth 2 (two) times daily.   Multiple Vitamin (MULTIVITAMIN) tablet Take 1 tablet by mouth daily.   NON FORMULARY Pt taking 1 Golo a day   Probiotic Product (PROBIOTIC PO) Take by mouth.   Calcium Carbonate-Vitamin D (OYSTER SHELL CALCIUM/D) 500-5 MG-MCG TABS Take 1 tablet by mouth daily. (Patient not taking: Reported on 02/24/2022)   cetirizine (ZYRTEC) 10 MG chewable tablet Chew 10 mg by mouth daily. (Patient not taking: Reported on 02/24/2022)   Cholecalciferol (VITAMIN D) 125 MCG (5000 UT) CAPS Take 1,000 Units by mouth daily.   diclofenac Sodium (VOLTAREN) 1 % GEL Apply 4 g topically 4 (four) times daily as needed. (Patient not taking: Reported on 03/20/2022)   fluticasone (FLONASE) 50 MCG/ACT nasal spray Place 1 spray into both nostrils daily. (Patient not taking: Reported on 02/24/2022)    hydrochlorothiazide (MICROZIDE) 12.5 MG capsule Take 12.5 mg by mouth as needed.   No facility-administered encounter medications on file as of 03/23/2022.     Review of Systems  Review of Systems  N/a Physical Exam  BP (!) 140/88   Pulse 74   Temp 98 F (36.7 C) (Oral)   Ht '5\' 6"'$  (1.676  m)   Wt 208 lb 12.8 oz (94.7 kg)   SpO2 96%   BMI 33.70 kg/m   Wt Readings from Last 5 Encounters:  03/23/22 208 lb 12.8 oz (94.7 kg)  03/20/22 208 lb 3.2 oz (94.4 kg)  02/24/22 212 lb 6.4 oz (96.3 kg)  01/05/22 215 lb (97.5 kg)  12/28/21 212 lb 12.8 oz (96.5 kg)    BMI Readings from Last 5 Encounters:  03/23/22 33.70 kg/m  03/20/22 33.60 kg/m  02/24/22 34.28 kg/m  01/05/22 34.70 kg/m  12/28/21 34.66 kg/m     Physical Exam General: Sitting in chair, no acute distress Eyes: EOMI, no icterus Neck: Supple, no JVP Pulmonary: Clear, normal work of breathing Cardiovascular: Warm, no edema Abdomen: Nondistended, bowel sounds present MSK: No synovitis, no joint effusion Neuro: Normal gait, no weakness Psych: Normal mood, full affect   Assessment & Plan:   Elevated Bicarbonate on routine blood tests/metabolic alkalosis: Unclear etiology.  Presumably compensation for respiratory acidosis.  Highest suspicion of OHS/atelectasis due to habitus, weight gain.  PFTs 12/2021 within normal notes, no obstruction, etc. to explain possible hypercapnia.  Home sleep test does reveal mild sleep apnea (AHI around 12) as well as having desaturation.  She refuses CPAP therapy encouragement to pursue this.  She is amenable to dentistry evaluation, referral sent today.  Nocturnal hypoxemia: Seems out of proportion to AHI on home sleep test.  She was offered oxygen therapy, encouraged recommended oxygen therapy, she refuses.  Abnormal CT scan: CT scan 2014 with left hemidiaphragm elevation with rounded appearing left-sided pleural change.  This is likely postprocedural following VATS for procedural  hemothorax.  No exertional symptoms are significant.  No further follow-up needed.  Return if symptoms worsen or fail to improve.   Lanier Clam, MD 03/23/2022  I spent 41 minutes in care of patient including face to face visit, coordin

## 2022-03-23 NOTE — Patient Instructions (Signed)
Nice to see you again  We discussed the results of the home sleep test, we agreed on a referral to dentistry to evaluate for an oral appliance to treat this  We discussed the low oxygen at night as well and decided against prescribing oxygen  We discussed the diaphragm on the left and the likely nerve injury to the phrenic nerve.  Given your ability to exercise, I see no value in a referral to a surgeon for further evaluation or further diagnostic testing which we agree on.  Return to clinic as needed

## 2022-03-24 ENCOUNTER — Other Ambulatory Visit: Payer: Self-pay | Admitting: Family Medicine

## 2022-03-24 ENCOUNTER — Ambulatory Visit: Payer: BC Managed Care – PPO | Admitting: Family Medicine

## 2022-03-24 DIAGNOSIS — E038 Other specified hypothyroidism: Secondary | ICD-10-CM

## 2022-03-24 MED ORDER — LEVOTHYROXINE SODIUM 25 MCG PO TABS: 25.0000 ug | ORAL_TABLET | Freq: Every day | ORAL | 2 refills | Status: DC

## 2022-03-24 NOTE — Telephone Encounter (Signed)
Duplicate encounter. Patient already advised

## 2022-03-25 ENCOUNTER — Encounter: Payer: Self-pay | Admitting: Family Medicine

## 2022-03-27 ENCOUNTER — Encounter: Payer: Self-pay | Admitting: Family Medicine

## 2022-03-29 ENCOUNTER — Ambulatory Visit: Payer: BC Managed Care – PPO | Admitting: Pulmonary Disease

## 2022-04-04 ENCOUNTER — Ambulatory Visit (HOSPITAL_COMMUNITY): Payer: BC Managed Care – PPO

## 2022-04-14 ENCOUNTER — Ambulatory Visit (INDEPENDENT_AMBULATORY_CARE_PROVIDER_SITE_OTHER): Payer: BC Managed Care – PPO | Admitting: Family Medicine

## 2022-04-14 ENCOUNTER — Encounter: Payer: Self-pay | Admitting: Family Medicine

## 2022-04-14 VITALS — BP 130/68 | HR 76 | Temp 97.9°F | Ht 66.0 in | Wt 208.6 lb

## 2022-04-14 DIAGNOSIS — J069 Acute upper respiratory infection, unspecified: Secondary | ICD-10-CM

## 2022-04-14 NOTE — Progress Notes (Signed)
[facility] [address] [place] [address] Dept: [phone] Dept Fax: [phone]  Office Visit  Subjective:    Patient ID: Leah Todd, female    DOB: [birth year], 62 y.o..   MRN: [id number]  Chief Complaint  Patient presents with   Cough    Cough, chest congestion x 1 week.  Has been taking Coricidin HPB and throat lounges.    History of Present Illness:  Patient is in today with a 1-week history of cough, mucous production, and nasal congestion. She was on a cruise last week to the [place]. She notes on Saturday, on the return to [place], she started to feel fatigued. On the flight home, she was having to pop her ears more. Her other symptoms developed after she returned home. Ms. Shove is not aware that she is running fever, though her husband thinks so. She has been using Coricidin HBP. She is not a smoker.  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   History of maternal deep vein thrombosis (DVT) 11/26/2015   Chest pain 11/13/2012   Actinic keratosis    Hyperlipidemia    Conjunctivitis unspecified    Bone disorder    Obesity    Past Surgical History:  Procedure Laterality Date   [place]  2020 & 2022   torn retina right eye/ detached retina  left eye   EYE SURGERY  12/2021   Detached retina left eye, bubble & hole, belt and freeze   [place] Left 12/2020   THORACOTOMY  2001   [place] LIGATION AND [place], 2006 & 2014   Family  History  Problem Relation Age of Onset   Stroke Mother    Arthritis Mother    Obesity Mother    Varicose Veins Mother    Bone cancer Father    Heart attack Father    Cancer Father    Heart disease Father    Hypertension Sister    Hypertension Brother    Colon polyps Brother    Stomach cancer Brother    Cancer Brother    Obesity Brother    Diabetes Maternal Grandmother    Arthritis Maternal Grandmother    Obesity Maternal Grandmother    Diabetes Maternal Grandfather    Diabetes Paternal Grandmother    Cancer Maternal Aunt    Cancer Maternal Aunt    Obesity Maternal Aunt    Obesity Son    Obesity Daughter    Varicose Veins Daughter    Colon cancer Neg Hx    Esophageal cancer Neg Hx    Rectal cancer Neg Hx    Outpatient Medications Prior to Visit  Medication Sig Dispense Refill   aspirin EC 81 MG tablet Take 81 mg by mouth daily.     atorvastatin (LIPITOR) 10 MG tablet Take 10 mg by mouth daily.     cetirizine (ZYRTEC) 10 MG chewable tablet Chew 10 mg by mouth daily.  fluticasone (FLONASE) 50 MCG/ACT nasal spray Place 1 spray into both nostrils daily.     hydrochlorothiazide (HYDRODIURIL) 12.5 MG tablet Take 12.5 mg by mouth daily.     levothyroxine (SYNTHROID) 25 MCG tablet Take 1 tablet (25 mcg total) by mouth daily before breakfast. 30 tablet 2   Multiple Vitamin (MULTIVITAMIN) tablet Take 1 tablet by mouth daily.     NON FORMULARY Pt taking 1 Golo a day     Probiotic Product (PROBIOTIC PO) Take by mouth.     lidocaine (LIDODERM) 5 % Place 1 patch onto the skin daily. Remove & Discard patch within 12 hours or as directed by MD 30 patch 0   methocarbamol (ROBAXIN) 500 MG tablet Take 1 tablet (500 mg total) by mouth 2 (two) times daily. 20 tablet 0   No facility-administered medications prior to visit.   Allergies  Allergen Reactions   Latex    Codeine    Erythromycin    Penicillins    Tetracyclines & Related      Objective:   Today's Vitals   04/14/22 1100   BP: 130/68  Pulse: 76  Temp: 97.9 F (36.6 C)  TempSrc: Temporal  SpO2: 94%  Weight: 208 lb 9.6 oz (94.6 kg)  Height: 5\' 6"  (1.676 m)   Body mass index is 33.67 kg/m.   General: Well developed, well nourished. No acute distress. HEENT: Normocephalic, non-traumatic. PERRL, EOMI. Conjunctiva clear. External ears normal.   Right TM is moderately congested without sign of infection. Nose  with mild congestion and   rhinorrhea. Mucous membranes moist. Mild cobblestoning of posterior oropharynx. Good dentition. Neck: Supple. No lymphadenopathy. No thyromegaly. Lungs: Clear to auscultation bilaterally. No wheezing, rales or rhonchi. Psych: Alert and oriented. Normal mood and affect.  Health Maintenance Due  Topic Date Due   HIV Screening  Never done   Hepatitis C Screening  Never done   PAP SMEAR-Modifier  Never done   Zoster Vaccines- Shingrix (2 of 2) 10/10/2021     Assessment & Plan:   Problem List Items Addressed This Visit       Respiratory   Viral URI with cough - Primary    Discussed home care for viral illness, including rest, pushing fluids, and OTC medications as needed for symptom relief.  She might consider adding some Afrin spray to help with her ear issues. Recommend hot tea with honey for sore throat symptoms. Follow-up if needed for worsening or persistent symptoms.        Return if symptoms worsen or fail to improve.   Haydee Salter, MD

## 2022-04-14 NOTE — Assessment & Plan Note (Signed)
Discussed home care for viral illness, including rest, pushing fluids, and OTC medications as needed for symptom relief.  She might consider adding some Afrin spray to help with her ear issues. Recommend hot tea with honey for sore throat symptoms. Follow-up if needed for worsening or persistent symptoms.

## 2022-04-24 ENCOUNTER — Encounter: Payer: Self-pay | Admitting: Family Medicine

## 2022-06-12 ENCOUNTER — Encounter: Payer: Self-pay | Admitting: Family Medicine

## 2022-06-22 ENCOUNTER — Encounter: Payer: Self-pay | Admitting: Family Medicine

## 2022-06-22 ENCOUNTER — Ambulatory Visit (INDEPENDENT_AMBULATORY_CARE_PROVIDER_SITE_OTHER): Payer: BC Managed Care – PPO | Admitting: Family Medicine

## 2022-06-22 VITALS — BP 132/82 | HR 76 | Temp 97.7°F | Wt 206.8 lb

## 2022-06-22 DIAGNOSIS — I10 Essential (primary) hypertension: Secondary | ICD-10-CM

## 2022-06-22 DIAGNOSIS — E039 Hypothyroidism, unspecified: Secondary | ICD-10-CM

## 2022-06-22 DIAGNOSIS — E038 Other specified hypothyroidism: Secondary | ICD-10-CM | POA: Diagnosis not present

## 2022-06-22 DIAGNOSIS — J986 Disorders of diaphragm: Secondary | ICD-10-CM

## 2022-06-22 DIAGNOSIS — M545 Low back pain, unspecified: Secondary | ICD-10-CM | POA: Diagnosis not present

## 2022-06-22 DIAGNOSIS — M2550 Pain in unspecified joint: Secondary | ICD-10-CM | POA: Insufficient documentation

## 2022-06-22 DIAGNOSIS — G8929 Other chronic pain: Secondary | ICD-10-CM

## 2022-06-22 NOTE — Assessment & Plan Note (Signed)
Leah Todd manages her joint pain effectively with turmeric.  Plan:  Continue using turmeric as needed. Counsel about supplement variability and potential liver toxicity. Monitor for new or worsening symptoms.

## 2022-06-22 NOTE — Assessment & Plan Note (Signed)
Persistent back pain with no acute findings on recent CT scan and history of rib surgery.  Plan:  Monitor symptoms and avoid aggravating factors. Consider neuropathic pain management if needed. Follow up if there are substantial changes in pain.

## 2022-06-22 NOTE — Assessment & Plan Note (Signed)
Khanh reports increasing shortness of breath on exertion, likely due to diaphragm paralysis from a past surgery.  Plan:  Encourage weight loss. Consider referral to a weight management program or new weight loss medications if needed. Reassess in 3 months.

## 2022-06-22 NOTE — Assessment & Plan Note (Signed)
Remell has subclinical hypothyroidism with a mildly elevated TSH. She is currently on Levothyroxine 25 mcg but reports no improvement in symptoms and experiences poor sleep.  Plan:  Discontinue Levothyroxine. Recheck thyroid levels in 6 weeks. Educate about minimal symptom relief at current TSH levels. Follow up in 6 months or sooner if symptoms worsen.

## 2022-06-22 NOTE — Patient Instructions (Addendum)
Stop taking levothyroxine and monitor how you feel off the medication. Inform the clinic if you notice any significant changes. Schedule a follow-up appointment to recheck thyroid levels and discuss any changes in your symptoms, particularly focusing on improvements or lack thereof in sleep. Continue taking turmeric after meals as it helps reduce inflammation and seems to alleviate joint swelling effectively. Monitor your back pain and report if it worsens or significantly impacts your daily activities. If pain becomes unbearable, contact the clinic for further assessment. Maintain a healthy diet and consider weight management strategies to aid with breathing difficulties. Regular gentle exercise like walking is encouraged.

## 2022-06-22 NOTE — Progress Notes (Signed)
Assessment/Plan:   Problem List Items Addressed This Visit       Cardiovascular and Mediastinum   Primary hypertension     Respiratory   Diaphragm paralysis    Leah Todd reports increasing shortness of breath on exertion, likely due to diaphragm paralysis from a past surgery.  Plan:  Encourage weight loss. Consider referral to a weight management program or new weight loss medications if needed. Reassess in 3 months.        Endocrine   Subclinical hypothyroidism - Primary    Leah Todd has subclinical hypothyroidism with a mildly elevated TSH. She is currently on Levothyroxine 25 mcg but reports no improvement in symptoms and experiences poor sleep.  Plan:  Discontinue Levothyroxine. Recheck thyroid levels in 6 weeks. Educate about minimal symptom relief at current TSH levels. Follow up in 6 months or sooner if symptoms worsen.      Relevant Orders   Thyroid Panel With TSH     Other   Chronic right-sided low back pain without sciatica    Persistent back pain with no acute findings on recent CT scan and history of rib surgery.  Plan:  Monitor symptoms and avoid aggravating factors. Consider neuropathic pain management if needed. Follow up if there are substantial changes in pain.      Pain in joints    Leah Todd manages her joint pain effectively with turmeric.  Plan:  Continue using turmeric as needed. Counsel about supplement variability and potential liver toxicity. Monitor for new or worsening symptoms.       Medications Discontinued During This Encounter  Medication Reason   levothyroxine (SYNTHROID) 25 MCG tablet     Return in about 6 months (around 12/23/2022) for BP.    Subjective:   Encounter date: 06/22/2022  Leah Todd is a 62 y.o. female who has Actinic keratosis; Hyperlipidemia; Conjunctivitis unspecified; Bone disorder; Obesity; Chest pain; History of maternal deep vein thrombosis (DVT); Pain in left lower leg; Paraganglioma (HCC);  Diaphragm paralysis; Right flank pain; Subclinical hypothyroidism; History of nephrolithiasis; Viral URI with cough; Chronic right-sided low back pain without sciatica; Primary hypertension; and Pain in joints on their problem list..   She  has a past medical history of Actinic keratosis, Allergy (1980), Anemia (1984), Arthritis (2020), Bloating, Bone disorder, Cancer (HCC), Cataract (2022), Conjunctivitis unspecified, Constipation, Elevated cholesterol, History of maternal deep vein thrombosis (DVT) (11/26/2015), Hypertension, Melanoma in situ (HCC), Obesity, Pneumonia, and Sleep apnea (It is still in question, not fully diagnosed)..   CHIEF COMPLAINT: Follow-up on thyroid condition and inquire about turmeric and joint supplements.  HISTORY OF PRESENT ILLNESS: Thyroid. The patient, Leah Todd, is here for a follow-up on her thyroid condition. She has been diagnosed with subclinical hypothyroidism, with a TSH ~5. She has been taking Levothyroxine 25 mcg in the mornings but reports not feeling any different. Leah Todd also mentioned that she is not sleeping as well since starting the medication and feels that it dissolves on her tongue before she can drink water.  Joint Supplements and Turmeric. Leah Todd inquired about the effectiveness and safety of turmeric and other joint supplements. She has taken turmeric previously and found it effective for reducing swelling and joint pain when taken after meals. She mentioned having bought several supplements but has not tried them yet.  Back Pain. Leah Todd has a history of tender back pain that was evaluated in the ER with no acute pathology found on the CT scan. She mentioned a past surgery involving rib cartilage removal and noted occasional  discomfort in that area, worsened by pressure.  Shortness of Breath. Leah Todd reports increasing difficulty in breathing during physical exertion. She related this to a paralyzed diaphragm from a surgery in 2001. She walks nearly 5  miles but has noticed worsening shortness of breath, particularly during humid conditions.  Review of Systems  Constitutional:  Positive for malaise/fatigue (no recent change). Negative for weight loss.  Respiratory:  Positive for shortness of breath.   Cardiovascular:  Negative for chest pain and palpitations.  Gastrointestinal:  Negative for abdominal pain.  Musculoskeletal:  Positive for back pain.  Neurological:  Negative for dizziness, tingling and headaches.  Psychiatric/Behavioral:  Negative for memory loss. The patient has insomnia.     Past Surgical History:  Procedure Laterality Date   ABDOMINAL HYSTERECTOMY  1994   APPENDECTOMY  1980   CESAREAN SECTION  1984   CESAREAN SECTION  1988   EYE SURGERY  2020 & 2022   torn retina right eye/ detached retina  left eye   EYE SURGERY  12/2021   Detached retina left eye, bubble & hole, belt and freeze   HERNIA REPAIR  1988   MELANOMA EXCISION     OVARIAN CYST SURGERY  1980   RETINAL DETACHMENT SURGERY Left 12/2020   THORACOTOMY  2001   TONSILLECTOMY  1972   VEIN LIGATION AND STRIPPING     1996, 2006 & 2014    Outpatient Medications Prior to Visit  Medication Sig Dispense Refill   aspirin EC 81 MG tablet Take 81 mg by mouth daily.     atorvastatin (LIPITOR) 10 MG tablet Take 10 mg by mouth daily.     cetirizine (ZYRTEC) 10 MG chewable tablet Chew 10 mg by mouth daily.     fluticasone (FLONASE) 50 MCG/ACT nasal spray Place 1 spray into both nostrils daily.     hydrochlorothiazide (HYDRODIURIL) 12.5 MG tablet Take 12.5 mg by mouth daily.     Multiple Vitamin (MULTIVITAMIN) tablet Take 1 tablet by mouth daily.     NON FORMULARY Pt taking 1 Golo a day     Probiotic Product (PROBIOTIC PO) Take by mouth.     levothyroxine (SYNTHROID) 25 MCG tablet Take 1 tablet (25 mcg total) by mouth daily before breakfast. 30 tablet 2   No facility-administered medications prior to visit.    Family History  Problem Relation Age of Onset    Stroke Mother    Arthritis Mother    Obesity Mother    Varicose Veins Mother    Bone cancer Father    Heart attack Father    Cancer Father    Heart disease Father    Hypertension Sister    Hypertension Brother    Colon polyps Brother    Stomach cancer Brother    Cancer Brother    Obesity Brother    Diabetes Maternal Grandmother    Arthritis Maternal Grandmother    Obesity Maternal Grandmother    Diabetes Maternal Grandfather    Diabetes Paternal Grandmother    Cancer Maternal Aunt    Cancer Maternal Aunt    Obesity Maternal Aunt    Obesity Son    Obesity Daughter    Varicose Veins Daughter    Colon cancer Neg Hx    Esophageal cancer Neg Hx    Rectal cancer Neg Hx     Social History   Socioeconomic History   Marital status: Married    Spouse name: Not on file   Number of children: 2   Years  of education: Not on file   Highest education level: Associate degree: academic program  Occupational History   Occupation: retired  Tobacco Use   Smoking status: Former   Smokeless tobacco: Not on file   Tobacco comments:    Mostly just social on and off  for all the years I smoked.  Vaping Use   Vaping Use: Never used  Substance and Sexual Activity   Alcohol use: Yes    Alcohol/week: 1.0 - 2.0 standard drink of alcohol    Types: 1 - 2 Standard drinks or equivalent per week    Comment: occasional   Drug use: No   Sexual activity: Yes    Birth control/protection: Other-see comments, None  Other Topics Concern   Not on file  Social History Narrative   Not on file   Social Determinants of Health   Financial Resource Strain: Low Risk  (06/20/2022)   Overall Financial Resource Strain (CARDIA)    Difficulty of Paying Living Expenses: Not hard at all  Food Insecurity: No Food Insecurity (06/20/2022)   Hunger Vital Sign    Worried About Running Out of Food in the Last Year: Never true    Ran Out of Food in the Last Year: Never true  Transportation Needs: No Transportation  Needs (06/20/2022)   PRAPARE - Administrator, Civil Service (Medical): No    Lack of Transportation (Non-Medical): No  Physical Activity: Sufficiently Active (06/20/2022)   Exercise Vital Sign    Days of Exercise per Week: 3 days    Minutes of Exercise per Session: 120 min  Stress: No Stress Concern Present (06/20/2022)   [employer] of Occupational Health - Occupational Stress Questionnaire    Feeling of Stress : Not at all  Social Connections: Unknown (06/20/2022)   Social Connection and Isolation Panel [NHANES]    Frequency of Communication with Friends and Family: Never    Frequency of Social Gatherings with Friends and Family: Once a week    Attends Religious Services: Patient declined    Database administrator or Organizations: No    Attends Engineer, structural: Not on file    Marital Status: Married  Catering manager Violence: Not on file                                                                                                  Objective:  Physical Exam: BP 132/82 (BP Location: Left Arm, Patient Position: Sitting, Cuff Size: Large)   Pulse 76   Temp 97.7 F (36.5 C) (Temporal)   Wt 206 lb 12.8 oz (93.8 kg)   SpO2 99%   BMI 33.38 kg/m   Wt Readings from Last 3 Encounters:  06/22/22 206 lb 12.8 oz (93.8 kg)  04/14/22 208 lb 9.6 oz (94.6 kg)  03/23/22 208 lb 12.8 oz (94.7 kg)     Physical Exam Constitutional:      General: She is not in acute distress.    Appearance: Normal appearance. She is not ill-appearing or toxic-appearing.  HENT:     Head: Normocephalic and atraumatic.  Nose: Nose normal. No congestion.  Eyes:     General: No scleral icterus.    Extraocular Movements: Extraocular movements intact.  Cardiovascular:     Rate and Rhythm: Normal rate and regular rhythm.     Pulses: Normal pulses.     Heart sounds: Normal heart sounds.  Pulmonary:     Effort: Pulmonary effort is normal. No respiratory distress.      Breath sounds: Normal breath sounds.  Abdominal:     General: Abdomen is flat. Bowel sounds are normal.     Palpations: Abdomen is soft.  Musculoskeletal:        General: Normal range of motion.     Lumbar back: Tenderness (right lateral back) present. No swelling, edema, deformity, signs of trauma, spasms or bony tenderness.  Lymphadenopathy:     Cervical: No cervical adenopathy.  Skin:    General: Skin is warm and dry.     Findings: No rash.  Neurological:     General: No focal deficit present.     Mental Status: She is alert and oriented to person, place, and time. Mental status is at baseline.  Psychiatric:        Mood and Affect: Mood normal.        Behavior: Behavior normal.        Thought Content: Thought content normal.        Judgment: Judgment normal.     No results found.  No results found for this or any previous visit (from the past 2160 hour(s)).      Garner Nash, MD, MS

## 2022-06-23 LAB — THYROID PANEL WITH TSH
Free Thyroxine Index: 1.8 (ref 1.4–3.8)
T3 Uptake: 28 % (ref 22–35)
T4, Total: 6.3 ug/dL (ref 5.1–11.9)
TSH: 3.55 mIU/L (ref 0.40–4.50)

## 2022-07-11 ENCOUNTER — Ambulatory Visit (INDEPENDENT_AMBULATORY_CARE_PROVIDER_SITE_OTHER): Payer: BC Managed Care – PPO | Admitting: Family Medicine

## 2022-07-11 ENCOUNTER — Encounter: Payer: Self-pay | Admitting: Family Medicine

## 2022-07-11 VITALS — BP 128/84 | HR 82 | Temp 97.9°F | Wt 205.0 lb

## 2022-07-11 DIAGNOSIS — T63301A Toxic effect of unspecified spider venom, accidental (unintentional), initial encounter: Secondary | ICD-10-CM | POA: Diagnosis not present

## 2022-07-11 DIAGNOSIS — J00 Acute nasopharyngitis [common cold]: Secondary | ICD-10-CM

## 2022-07-11 DIAGNOSIS — I10 Essential (primary) hypertension: Secondary | ICD-10-CM

## 2022-07-11 DIAGNOSIS — E038 Other specified hypothyroidism: Secondary | ICD-10-CM

## 2022-07-11 DIAGNOSIS — J310 Chronic rhinitis: Secondary | ICD-10-CM | POA: Insufficient documentation

## 2022-07-11 DIAGNOSIS — R051 Acute cough: Secondary | ICD-10-CM

## 2022-07-11 MED ORDER — HYDROCHLOROTHIAZIDE 12.5 MG PO TABS: 12.5000 mg | ORAL_TABLET | Freq: Every day | ORAL | 3 refills | Status: DC

## 2022-07-11 MED ORDER — DM-GUAIFENESIN ER 30-600 MG PO TB12: 1.0000 | ORAL_TABLET | Freq: Two times a day (BID) | ORAL | 0 refills | Status: AC | PRN

## 2022-07-11 MED ORDER — CLINDAMYCIN HCL 300 MG PO CAPS: 300.0000 mg | ORAL_CAPSULE | Freq: Three times a day (TID) | ORAL | 0 refills | Status: AC

## 2022-07-11 MED ORDER — FLUTICASONE PROPIONATE 50 MCG/ACT NA SUSP: 2.0000 | Freq: Every day | NASAL | 0 refills | Status: DC

## 2022-07-11 NOTE — Assessment & Plan Note (Signed)
Stable.  Continue hydrochlorothiazide 12.5 mg.  Refilled for 1 year.

## 2022-07-11 NOTE — Assessment & Plan Note (Signed)
Appears to be causing local skin infection   Start clindamycin 300 mg three times daily for 7 days for possible cellulitis Advise monitoring for signs of improvement or worsening, particularly signs of systemic infection

## 2022-07-11 NOTE — Progress Notes (Signed)
Assessment/Plan:   Problem List Items Addressed This Visit       Cardiovascular and Mediastinum   Primary hypertension    Stable.  Continue hydrochlorothiazide 12.5 mg.  Refilled for 1 year.      Relevant Medications   hydrochlorothiazide (HYDRODIURIL) 12.5 MG tablet     Respiratory   Acute rhinitis - Primary    Differential diagnosis:  Viral URI Allergic rhinitis Sinusitis (less likely without sinus pressure or tooth pain)  Plan:  Increase Flonase to 2 squirts daily Start Mucinex DM (guaifenesin and dextromethorphan), extended-release, following dosing recommendations Continue cetirizine Educate on Afrin rebound congestion and recommend limiting use Encourage hydration, use of cough drops, teas, and honey Monitor for any worsening symptoms, particularly if symptoms persist more than 7 days      Relevant Medications   dextromethorphan-guaiFENesin (MUCINEX DM) 30-600 MG 12hr tablet     Endocrine   Subclinical hypothyroidism    TSH previously noted at 5.7, reduced to 3.5 on low-dose Synthroid but caused sleep disturbances Sleep disturbance issues have improved No concern for active thyroid dysfunction based on symptoms, will monitor TSH periodically Plan to keep patient off Synthroid unless symptoms of hypothyroidism develop        Other   Spider bite    Appears to be causing local skin infection   Start clindamycin 300 mg three times daily for 7 days for possible cellulitis Advise monitoring for signs of improvement or worsening, particularly signs of systemic infection      Relevant Medications   clindamycin (CLEOCIN) 300 MG capsule   Other Visit Diagnoses     Acute cough       Relevant Medications   fluticasone (FLONASE) 50 MCG/ACT nasal spray       Medications Discontinued During This Encounter  Medication Reason   fluticasone (FLONASE) 50 MCG/ACT nasal spray Reorder   hydrochlorothiazide (HYDRODIURIL) 12.5 MG tablet Reorder    Return if  symptoms worsen or fail to improve, for congestion, spider bite.    Subjective:   Encounter date: 07/11/2022  Leah Todd is a 62 y.o. female who has Actinic keratosis; Hyperlipidemia; Conjunctivitis unspecified; Bone disorder; Obesity; Chest pain; History of maternal deep vein thrombosis (DVT); Pain in left lower leg; Paraganglioma (HCC); Diaphragm paralysis; Right flank pain; Subclinical hypothyroidism; History of nephrolithiasis; Viral URI with cough; Chronic right-sided low back pain without sciatica; Primary hypertension; Pain in joints; Acute rhinitis; and Spider bite on their problem list..   She  has a past medical history of Actinic keratosis, Allergy (1980), Anemia (1984), Arthritis (2020), Bloating, Bone disorder, Cancer (HCC), Cataract (2022), Conjunctivitis unspecified, Constipation, Elevated cholesterol, History of maternal deep vein thrombosis (DVT) (11/26/2015), Hypertension, Melanoma in situ (HCC), Obesity, Pneumonia, and Sleep apnea (It is still in question, not fully diagnosed)..   Chief Complaint: Patient presents with cough, congestion, and discussion about thyroid management.  History of Present Illness: Cough and Congestion: Patient reports a worsening cough and nasal congestion since Saturday. This condition appears to be exacerbated every June, correlating with her allergies. She mentions receiving a steroid shot in the past years which was effective. Currently, the nasal discharge is green. She experienced significant coughing attacks, specifically stating a 10-minute coughing episode yesterday. Facial pain, fever, and chills are denied. She has been managing symptoms with Afrin and generic cetirizine, but Afrin is a concern due to her high blood pressure and the risk of rebound congestion after prolonged usage. The patient has not tried other over-the-counter medications like Mucinex  DM recently and avoids Benadryl due to its sedative properties.  Thyroid  Management: The patient has a history of subclinical hypothyroidism with a previous TSH of 5.7, which normalized to 3.5 with Synthroid 25 mcg. However, she discontinued Synthroid due to its impact on her sleep. Since discontinuation, she reports improved sleep quality.  Finger Swelling. Charmian also reports swelling and redness in her pointer left finger, which she attributes to multiple insect bites possibly from spiders.  This occurred approximately 3 days ago.  Pain and swelling are getting worse.  Approximately 2 years ago, she previously experienced severe reactions to similar bites, requiring urgent care and antibiotic treatment. She is aware of her allergies to penicillin, erythromycin, and tetracyclines and has opted for clindamycin as a treatment for the current episode.  Hypertension.  Patient needs refill on hydrochlorothiazide 12.5 mg.  Review of Systems  Constitutional:  Negative for chills and fever.  HENT:  Positive for congestion. Negative for ear discharge, ear pain and hearing loss.   Eyes:  Negative for blurred vision, double vision, photophobia, pain, discharge and redness.  Respiratory:  Negative for cough, sputum production, shortness of breath and wheezing.   Cardiovascular:  Negative for chest pain and palpitations.  Gastrointestinal:  Negative for abdominal pain, blood in stool, constipation, diarrhea, heartburn, melena, nausea and vomiting.  Genitourinary:  Negative for dysuria, flank pain, frequency, hematuria and urgency.  Musculoskeletal:  Negative for myalgias.       Swelling and redness in the left pointer finger from insect bites  Neurological:  Negative for dizziness, tingling, tremors, speech change, seizures, loss of consciousness, weakness and headaches.  Psychiatric/Behavioral:  Negative for depression, hallucinations, memory loss, substance abuse and suicidal ideas. The patient does not have insomnia (improved).   All other systems reviewed and are  negative.   Past Surgical History:  Procedure Laterality Date   ABDOMINAL HYSTERECTOMY  1994   APPENDECTOMY  1980   CESAREAN SECTION  1984   CESAREAN SECTION  1988   EYE SURGERY  2020 & 2022   torn retina right eye/ detached retina  left eye   EYE SURGERY  12/2021   Detached retina left eye, bubble & hole, belt and freeze   HERNIA REPAIR  1988   MELANOMA EXCISION     OVARIAN CYST SURGERY  1980   RETINAL DETACHMENT SURGERY Left 12/2020   THORACOTOMY  2001   TONSILLECTOMY  1972   VEIN LIGATION AND STRIPPING     1996, 2006 & 2014    Outpatient Medications Prior to Visit  Medication Sig Dispense Refill   aspirin EC 81 MG tablet Take 81 mg by mouth daily.     atorvastatin (LIPITOR) 10 MG tablet Take 10 mg by mouth daily.     cetirizine (ZYRTEC) 10 MG chewable tablet Chew 10 mg by mouth daily.     Multiple Vitamin (MULTIVITAMIN) tablet Take 1 tablet by mouth daily.     NON FORMULARY Pt taking 1 Golo a day     Phenylephrine HCl (AFRIN ALLERGY NA) Place into the nose.     Probiotic Product (PROBIOTIC PO) Take by mouth.     fluticasone (FLONASE) 50 MCG/ACT nasal spray Place 1 spray into both nostrils daily.     hydrochlorothiazide (HYDRODIURIL) 12.5 MG tablet Take 12.5 mg by mouth daily.     No facility-administered medications prior to visit.    Family History  Problem Relation Age of Onset   Stroke Mother    Arthritis Mother  Obesity Mother    Varicose Veins Mother    Bone cancer Father    Heart attack Father    Cancer Father    Heart disease Father    Hypertension Sister    Hypertension Brother    Colon polyps Brother    Stomach cancer Brother    Cancer Brother    Obesity Brother    Diabetes Maternal Grandmother    Arthritis Maternal Grandmother    Obesity Maternal Grandmother    Diabetes Maternal Grandfather    Diabetes Paternal Grandmother    Cancer Maternal Aunt    Cancer Maternal Aunt    Obesity Maternal Aunt    Obesity Son    Obesity Daughter     Varicose Veins Daughter    Colon cancer Neg Hx    Esophageal cancer Neg Hx    Rectal cancer Neg Hx     Social History   Socioeconomic History   Marital status: Married    Spouse name: Not on file   Number of children: 2   Years of education: Not on file   Highest education level: Associate degree: academic program  Occupational History   Occupation: retired  Tobacco Use   Smoking status: Former   Smokeless tobacco: Not on file   Tobacco comments:    Mostly just social on and off  for all the years I smoked.  Vaping Use   Vaping Use: Never used  Substance and Sexual Activity   Alcohol use: Yes    Alcohol/week: 1.0 - 2.0 standard drink of alcohol    Types: 1 - 2 Standard drinks or equivalent per week    Comment: occasional   Drug use: No   Sexual activity: Yes    Birth control/protection: Other-see comments, None  Other Topics Concern   Not on file  Social History Narrative   Not on file   Social Determinants of Health   Financial Resource Strain: Low Risk  (06/20/2022)   Overall Financial Resource Strain (CARDIA)    Difficulty of Paying Living Expenses: Not hard at all  Food Insecurity: No Food Insecurity (06/20/2022)   Hunger Vital Sign    Worried About Running Out of Food in the Last Year: Never true    Ran Out of Food in the Last Year: Never true  Transportation Needs: No Transportation Needs (06/20/2022)   PRAPARE - Administrator, Civil Service (Medical): No    Lack of Transportation (Non-Medical): No  Physical Activity: Sufficiently Active (06/20/2022)   Exercise Vital Sign    Days of Exercise per Week: 3 days    Minutes of Exercise per Session: 120 min  Stress: No Stress Concern Present (06/20/2022)   [employer] of Occupational Health - Occupational Stress Questionnaire    Feeling of Stress : Not at all  Social Connections: Unknown (06/20/2022)   Social Connection and Isolation Panel [NHANES]    Frequency of Communication with Friends and  Family: Never    Frequency of Social Gatherings with Friends and Family: Once a week    Attends Religious Services: Patient declined    Database administrator or Organizations: No    Attends Engineer, structural: Not on file    Marital Status: Married  Catering manager Violence: Not on file  Objective:  Physical Exam: BP 128/84 (BP Location: Left Arm, Patient Position: Sitting, Cuff Size: Large)   Pulse 82   Temp 97.9 F (36.6 C) (Temporal)   Wt 205 lb (93 kg)   SpO2 95%   BMI 33.09 kg/m     Physical Exam Constitutional:      General: She is not in acute distress.    Appearance: Normal appearance. She is not ill-appearing or toxic-appearing.  HENT:     Head: Normocephalic and atraumatic.     Nose: Nose normal. No congestion.  Eyes:     General: No scleral icterus.    Extraocular Movements: Extraocular movements intact.  Cardiovascular:     Rate and Rhythm: Normal rate and regular rhythm.     Pulses: Normal pulses.     Heart sounds: Normal heart sounds.  Pulmonary:     Effort: Pulmonary effort is normal. No respiratory distress.     Breath sounds: Normal breath sounds.  Abdominal:     General: Abdomen is flat. Bowel sounds are normal.     Palpations: Abdomen is soft.  Musculoskeletal:        General: Normal range of motion.     Lumbar back: Tenderness (right lateral back) present. No swelling, edema, deformity, signs of trauma, spasms or bony tenderness.  Lymphadenopathy:     Cervical: No cervical adenopathy.  Skin:    General: Skin is warm and dry.     Findings: Lesion present.     Comments: Multiple insect bites along lateral left pointer finger and dorsal hand, some violaceous appearance, no drainage  Neurological:     General: No focal deficit present.     Mental Status: She is alert and oriented to person, place, and time. Mental status is at baseline.   Psychiatric:        Mood and Affect: Mood normal.        Behavior: Behavior normal.        Thought Content: Thought content normal.        Judgment: Judgment normal.     No results found.  Recent Results (from the past 2160 hour(s))  Thyroid Panel With TSH     Status: None   Collection Time: 06/22/22  3:09 PM  Result Value Ref Range   T3 Uptake 28 22 - 35 %   T4, Total 6.3 5.1 - 11.9 mcg/dL   Free Thyroxine Index 1.8 1.4 - 3.8   TSH 3.55 0.40 - 4.50 mIU/L        Garner Nash, MD, MS

## 2022-07-11 NOTE — Patient Instructions (Addendum)
  For Cough: Flonase: Use 2 squirts per day in each nostril to help manage allergy symptoms. Mucinex DM: Take the extended-release combination of guaifenesin and dextromethorphan to help with cough and congestion.  Cough Drops and Teas: Use cough drops and drink tea with honey to soothe throat irritation. Avoid Afrin: Reduce or stop using Afrin as it may cause a rebound effect, making symptoms worse in the long term. Stay Hydrated: Drink plenty of fluids to help thin mucus and stay hydrated.  For Finger Bite Infection: Clindamycin: Take 300 mg three times a day for 7 days.  Monitor Symptoms: Watch your symptoms and if they do not improve in 7 days or worsen, please seek further medical attention.

## 2022-07-11 NOTE — Assessment & Plan Note (Signed)
TSH previously noted at 5.7, reduced to 3.5 on low-dose Synthroid but caused sleep disturbances Sleep disturbance issues have improved No concern for active thyroid dysfunction based on symptoms, will monitor TSH periodically Plan to keep patient off Synthroid unless symptoms of hypothyroidism develop

## 2022-07-11 NOTE — Assessment & Plan Note (Signed)
Differential diagnosis:  Viral URI Allergic rhinitis Sinusitis (less likely without sinus pressure or tooth pain)  Plan:  Increase Flonase to 2 squirts daily Start Mucinex DM (guaifenesin and dextromethorphan), extended-release, following dosing recommendations Continue cetirizine Educate on Afrin rebound congestion and recommend limiting use Encourage hydration, use of cough drops, teas, and honey Monitor for any worsening symptoms, particularly if symptoms persist more than 7 days

## 2022-08-23 ENCOUNTER — Encounter (INDEPENDENT_AMBULATORY_CARE_PROVIDER_SITE_OTHER): Payer: Self-pay

## 2022-09-21 ENCOUNTER — Ambulatory Visit (INDEPENDENT_AMBULATORY_CARE_PROVIDER_SITE_OTHER): Payer: BC Managed Care – PPO | Admitting: Family Medicine

## 2022-09-21 ENCOUNTER — Encounter: Payer: Self-pay | Admitting: Family Medicine

## 2022-09-21 VITALS — BP 144/72 | HR 63 | Temp 98.0°F | Resp 16 | Ht 66.0 in | Wt 211.4 lb

## 2022-09-21 DIAGNOSIS — J310 Chronic rhinitis: Secondary | ICD-10-CM

## 2022-09-21 DIAGNOSIS — E038 Other specified hypothyroidism: Secondary | ICD-10-CM | POA: Diagnosis not present

## 2022-09-21 DIAGNOSIS — I10 Essential (primary) hypertension: Secondary | ICD-10-CM

## 2022-09-21 DIAGNOSIS — Z7185 Encounter for immunization safety counseling: Secondary | ICD-10-CM

## 2022-09-21 MED ORDER — FLUTICASONE PROPIONATE 50 MCG/ACT NA SUSP: 2.0000 | Freq: Every day | NASAL | 3 refills | Status: DC

## 2022-09-21 MED ORDER — HYDROCHLOROTHIAZIDE 12.5 MG PO TABS: 25.0000 mg | ORAL_TABLET | Freq: Every day | ORAL | 3 refills | Status: DC

## 2022-09-21 NOTE — Assessment & Plan Note (Signed)
Flonase refilled.

## 2022-09-21 NOTE — Progress Notes (Signed)
Assessment/Plan:   Problem List Items Addressed This Visit       Cardiovascular and Mediastinum   Primary hypertension    Chronic, unstable Plan: Increase hydrochlorothiazide to 25 mg daily. Monitor blood pressure at home. Follow-up in 3 months to reassess blood pressure control.      Relevant Medications   hydrochlorothiazide (HYDRODIURIL) 12.5 MG tablet     Respiratory   Chronic rhinitis    Flonase refilled      Relevant Medications   fluticasone (FLONASE) 50 MCG/ACT nasal spray     Endocrine   Subclinical hypothyroidism - Primary    Plan: Re-evaluate thyroid panel to determine current status. Consider restarting levothyroxine if thyroid levels are low.      Relevant Orders   Thyroid Panel With TSH     Other   Vaccine counseling    Medications Discontinued During This Encounter  Medication Reason   Phenylephrine HCl (AFRIN ALLERGY NA) Patient Preference   fluticasone (FLONASE) 50 MCG/ACT nasal spray Reorder   hydrochlorothiazide (HYDRODIURIL) 12.5 MG tablet     No follow-ups on file.    Subjective:   Encounter date: 09/21/2022  NAJMA WELLMAN is a 62 y.o. female who has Actinic keratosis; Hyperlipidemia; Conjunctivitis unspecified; Bone disorder; Obesity; Chest pain; History of maternal deep vein thrombosis (DVT); Pain in left lower leg; Paraganglioma (HCC); Diaphragm paralysis; Right flank pain; Subclinical hypothyroidism; History of nephrolithiasis; Viral URI with cough; Chronic right-sided low back pain without sciatica; Primary hypertension; Pain in joints; Chronic rhinitis; Spider bite; and Vaccine counseling on their problem list..   She  has a past medical history of Actinic keratosis, Allergy (1980), Anemia (1984), Arthritis (2020), Bloating, Bone disorder, Cancer (HCC), Cataract (2022), Conjunctivitis unspecified, Constipation, Elevated cholesterol, History of maternal deep vein thrombosis (DVT) (11/26/2015), Hypertension, Melanoma in situ (HCC),  Obesity, Pneumonia, and Sleep apnea (It is still in question, not fully diagnosed)..   Chief Complaint: Follow-up visit for thyroid monitoring and blood pressure management.  History of Present Illness:  Thyroid and Blood Pressure Monitoring:  Patient is here for a follow-up on her thyroid function and blood pressure. Previous doctor did not keep records of her immunizations; however, patient recalls having had a pneumonia shot, flu shots, COVID shots, tetanus shot, and whooping cough shot. Blood pressure today is slightly elevated at 144/77 mmHg. Currently on hydrochlorothiazide 12.5 mg with no reported side effects.  Reports frequent headaches, possibly related to other factors;   Rhinitis.  Uses Flonase effectively and requests a refill.  Vaccination Records:  Previous doctor did not have a record of patient's pneumonia shot. Would like to get a shingles vaccine due to a history of severe chickenpox. Discussed other vaccines including flu, COVID, and RSV.  Weight and Thyroid Management:  Patient's thyroid function is being monitored; previously on levothyroxine 25 mcg but stopped due to borderline thyroid levels. Reports recent easy weight gain and fluctuating energy levels after stopping levothyroxine. No significant weight gain, fluctuates about 5 pounds; patient requests re-evaluation of thyroid function.  Review of Systems  Constitutional:  Negative for chills, diaphoresis, fever, malaise/fatigue and weight loss.  HENT:  Positive for congestion. Negative for ear discharge, ear pain and hearing loss.   Eyes:  Negative for blurred vision, double vision, photophobia, pain, discharge and redness.  Respiratory:  Negative for cough, sputum production, shortness of breath and wheezing.   Cardiovascular:  Negative for chest pain and palpitations.  Gastrointestinal:  Negative for abdominal pain, blood in stool, constipation, diarrhea, heartburn, melena,  nausea and vomiting.   Genitourinary:  Negative for dysuria, flank pain, frequency, hematuria and urgency.  Musculoskeletal:  Negative for myalgias.  Skin:  Negative for itching and rash.  Neurological:  Positive for headaches. Negative for dizziness, tingling, tremors, speech change, seizures, loss of consciousness and weakness.  Endo/Heme/Allergies:  Positive for environmental allergies.  Psychiatric/Behavioral:  Negative for depression, hallucinations, memory loss, substance abuse and suicidal ideas. The patient does not have insomnia.   All other systems reviewed and are negative.   Past Surgical History:  Procedure Laterality Date   ABDOMINAL HYSTERECTOMY  1994   APPENDECTOMY  1980   CESAREAN SECTION  1984   CESAREAN SECTION  1988   EYE SURGERY  2020 & 2022   torn retina right eye/ detached retina  left eye   EYE SURGERY  12/2021   Detached retina left eye, bubble & hole, belt and freeze   HERNIA REPAIR  1988   MELANOMA EXCISION     OVARIAN CYST SURGERY  1980   RETINAL DETACHMENT SURGERY Left 12/2020   THORACOTOMY  2001   TONSILLECTOMY  1972   VEIN LIGATION AND STRIPPING     1996, 2006 & 2014    Outpatient Medications Prior to Visit  Medication Sig Dispense Refill   aspirin EC 81 MG tablet Take 81 mg by mouth daily.     atorvastatin (LIPITOR) 10 MG tablet Take 10 mg by mouth daily.     cetirizine (ZYRTEC) 10 MG chewable tablet Chew 10 mg by mouth daily.     Multiple Vitamin (MULTIVITAMIN) tablet Take 1 tablet by mouth daily.     NON FORMULARY Pt taking 1 Golo a day     Probiotic Product (PROBIOTIC PO) Take by mouth.     hydrochlorothiazide (HYDRODIURIL) 12.5 MG tablet Take 1 tablet (12.5 mg total) by mouth daily. 90 tablet 3   fluticasone (FLONASE) 50 MCG/ACT nasal spray Place 2 sprays into both nostrils daily. 1 g 0   Phenylephrine HCl (AFRIN ALLERGY NA) Place into the nose.     No facility-administered medications prior to visit.    Family History  Problem Relation Age of Onset    Stroke Mother    Arthritis Mother    Obesity Mother    Varicose Veins Mother    Bone cancer Father    Heart attack Father    Cancer Father    Heart disease Father    Hypertension Sister    Hypertension Brother    Colon polyps Brother    Stomach cancer Brother    Cancer Brother    Obesity Brother    Diabetes Maternal Grandmother    Arthritis Maternal Grandmother    Obesity Maternal Grandmother    Diabetes Maternal Grandfather    Diabetes Paternal Grandmother    Cancer Maternal Aunt    Cancer Maternal Aunt    Obesity Maternal Aunt    Obesity Son    Obesity Daughter    Varicose Veins Daughter    Colon cancer Neg Hx    Esophageal cancer Neg Hx    Rectal cancer Neg Hx     Social History   Socioeconomic History   Marital status: Married    Spouse name: Not on file   Number of children: 2   Years of education: Not on file   Highest education level: Associate degree: academic program  Occupational History   Occupation: retired  Tobacco Use   Smoking status: Former   Smokeless tobacco: Not on file  Tobacco comments:    Mostly just social on and off  for all the years I smoked.  Vaping Use   Vaping status: Never Used  Substance and Sexual Activity   Alcohol use: Yes    Alcohol/week: 1.0 - 2.0 standard drink of alcohol    Types: 1 - 2 Standard drinks or equivalent per week    Comment: occasional   Drug use: No   Sexual activity: Yes    Birth control/protection: Other-see comments, None  Other Topics Concern   Not on file  Social History Narrative   Not on file   Social Determinants of Health   Financial Resource Strain: Low Risk  (06/20/2022)   Overall Financial Resource Strain (CARDIA)    Difficulty of Paying Living Expenses: Not hard at all  Food Insecurity: No Food Insecurity (06/20/2022)   Hunger Vital Sign    Worried About Running Out of Food in the Last Year: Never true    Ran Out of Food in the Last Year: Never true  Transportation Needs: No  Transportation Needs (06/20/2022)   PRAPARE - Administrator, Civil Service (Medical): No    Lack of Transportation (Non-Medical): No  Physical Activity: Sufficiently Active (06/20/2022)   Exercise Vital Sign    Days of Exercise per Week: 3 days    Minutes of Exercise per Session: 120 min  Stress: No Stress Concern Present (06/20/2022)   [employer] of Occupational Health - Occupational Stress Questionnaire    Feeling of Stress : Not at all  Social Connections: Unknown (06/20/2022)   Social Connection and Isolation Panel [NHANES]    Frequency of Communication with Friends and Family: Never    Frequency of Social Gatherings with Friends and Family: Once a week    Attends Religious Services: Patient declined    Database administrator or Organizations: No    Attends Engineer, structural: Not on file    Marital Status: Married  Intimate Partner Violence: Unknown (04/27/2021)   Received from [facility]   HITS    Physically Hurt: Not on file    Insult or Talk Down To: Not on file    Threaten Physical Harm: Not on file    Scream or Curse: Not on file                                                                                                  Objective:  Physical Exam: BP (!) 144/72 (BP Location: Left Arm, Patient Position: Sitting, Cuff Size: Large)   Pulse 63   Temp 98 F (36.7 C)   Resp 16   Ht 5\' 6"  (1.676 m)   Wt 211 lb 6.4 oz (95.9 kg)   SpO2 98%   BMI 34.12 kg/m   Wt Readings from Last 3 Encounters:  09/21/22 211 lb 6.4 oz (95.9 kg)  07/11/22 205 lb (93 kg)  06/22/22 206 lb 12.8 oz (93.8 kg)     Physical Exam Constitutional:      General: She is not in acute distress.    Appearance: Normal appearance. She is  not ill-appearing or toxic-appearing.  HENT:     Head: Normocephalic and atraumatic.     Nose: Nose normal. No congestion.  Eyes:     General: No scleral icterus.    Extraocular Movements: Extraocular movements intact.   Cardiovascular:     Rate and Rhythm: Normal rate and regular rhythm.     Pulses: Normal pulses.     Heart sounds: Normal heart sounds.  Pulmonary:     Effort: Pulmonary effort is normal. No respiratory distress.     Breath sounds: Normal breath sounds.  Abdominal:     General: Abdomen is flat. Bowel sounds are normal.     Palpations: Abdomen is soft.  Musculoskeletal:        General: Normal range of motion.  Lymphadenopathy:     Cervical: No cervical adenopathy.  Skin:    General: Skin is warm and dry.     Findings: No rash.  Neurological:     General: No focal deficit present.     Mental Status: She is alert and oriented to person, place, and time. Mental status is at baseline.  Psychiatric:        Mood and Affect: Mood normal.        Behavior: Behavior normal.        Thought Content: Thought content normal.        Judgment: Judgment normal.     No results found.  No results found for this or any previous visit (from the past 2160 hour(s)).      Garner Nash, MD, MS

## 2022-09-21 NOTE — Assessment & Plan Note (Signed)
Chronic, unstable Plan: Increase hydrochlorothiazide to 25 mg daily. Monitor blood pressure at home. Follow-up in 3 months to reassess blood pressure control.

## 2022-09-21 NOTE — Assessment & Plan Note (Signed)
Plan: Re-evaluate thyroid panel to determine current status. Consider restarting levothyroxine if thyroid levels are low.

## 2022-09-21 NOTE — Patient Instructions (Addendum)
Blood Pressure Monitoring: Increase hydrochlorothiazide to 25 mg a day.  Please monitor for any symptoms such as dizziness.  Please continue to keep track of your blood pressure at home and ensure the measurements are consistent each time. Recheck it within the next week, and report any significant changes. Vaccinations: Consider getting the shingles and pneumonia vaccines. You can receive these at our clinic or at a pharmacy, depending on your insurance coverage. Follow-up Lab Work: We need to recheck your thyroid panel. No special preparation is required before this test. General Wellness: Keep an eye on your weight and energy levels. Note any significant changes and report them during your next visit.

## 2022-09-22 ENCOUNTER — Other Ambulatory Visit: Payer: Self-pay | Admitting: Family Medicine

## 2022-09-22 DIAGNOSIS — E038 Other specified hypothyroidism: Secondary | ICD-10-CM

## 2022-09-22 LAB — THYROID PANEL WITH TSH
Free Thyroxine Index: 1.5 (ref 1.4–3.8)
T3 Uptake: 28 % (ref 22–35)
T4, Total: 5.4 ug/dL (ref 5.1–11.9)
TSH: 5.6 m[IU]/L — ABNORMAL HIGH (ref 0.40–4.50)

## 2022-09-22 MED ORDER — LEVOTHYROXINE SODIUM 25 MCG PO TABS: 25.0000 ug | ORAL_TABLET | Freq: Every day | ORAL | 3 refills | Status: DC

## 2022-09-22 NOTE — Addendum Note (Signed)
Addended by: Garnette Gunner on: 09/22/2022 08:47 AM   Modules accepted: Orders

## 2022-10-09 ENCOUNTER — Encounter: Payer: Self-pay | Admitting: Family Medicine

## 2022-10-09 ENCOUNTER — Ambulatory Visit (INDEPENDENT_AMBULATORY_CARE_PROVIDER_SITE_OTHER): Payer: BC Managed Care – PPO | Admitting: Family Medicine

## 2022-10-09 VITALS — BP 152/82 | HR 79 | Temp 97.9°F | Wt 210.0 lb

## 2022-10-09 DIAGNOSIS — R221 Localized swelling, mass and lump, neck: Secondary | ICD-10-CM | POA: Insufficient documentation

## 2022-10-09 NOTE — Assessment & Plan Note (Signed)
Patient presents with a tender, newly discovered bump on the left side of the neck. History of paraganglioma on the right side, and former smoking history.  Differential diagnosis: Reactive lymphadenopathy: Unlikely given unremarkable infectious symptoms but still plausible. Recurrence of paraganglioma: Given past history, albeit on the opposite side. Head or neck cancer: Must be ruled out given smoking history and age. Plan: Order CT scan with contrast to evaluate the lump. Obtain current kidney function test prior to imaging. Basic blood work including BMP, CBC, ESR, CRP today. Schedule follow-up to review scan results. Manage pain with ibuprofen or Tylenol as needed. Patient to monitor for any progression of symptoms and call if there are new developments (increase in size, persistent pain, fever, chills, etc.).

## 2022-10-09 NOTE — Progress Notes (Signed)
Assessment/Plan:   Problem List Items Addressed This Visit       Other   Neck mass - Primary    Patient presents with a tender, newly discovered bump on the left side of the neck. History of paraganglioma on the right side, and former smoking history.  Differential diagnosis: Reactive lymphadenopathy: Unlikely given unremarkable infectious symptoms but still plausible. Recurrence of paraganglioma: Given past history, albeit on the opposite side. Head or neck cancer: Must be ruled out given smoking history and age. Plan: Order CT scan with contrast to evaluate the lump. Obtain current kidney function test prior to imaging. Basic blood work including BMP, CBC, ESR, CRP today. Schedule follow-up to review scan results. Manage pain with ibuprofen or Tylenol as needed. Patient to monitor for any progression of symptoms and call if there are new developments (increase in size, persistent pain, fever, chills, etc.).      Relevant Orders   CBC w/Diff   Basic Metabolic Panel (BMET)   C-reactive protein   Sedimentation rate   CT SOFT TISSUE NECK W CONTRAST    There are no discontinued medications.  No follow-ups on file.    Subjective:   Encounter date: 10/09/2022  Leah Todd is a 62 y.o. female who has Actinic keratosis; Hyperlipidemia; Conjunctivitis unspecified; Bone disorder; Obesity; Chest pain; History of maternal deep vein thrombosis (DVT); Pain in left lower leg; Paraganglioma (HCC); Diaphragm paralysis; Right flank pain; Subclinical hypothyroidism; History of nephrolithiasis; Viral URI with cough; Chronic right-sided low back pain without sciatica; Primary hypertension; Pain in joints; Chronic rhinitis; Spider bite; Vaccine counseling; and Neck mass on their problem list..   She  has a past medical history of Actinic keratosis, Allergy (1980), Anemia (1984), Arthritis (2020), Bloating, Bone disorder, Cancer (HCC), Cataract (2022), Conjunctivitis unspecified,  Constipation, Elevated cholesterol, History of maternal deep vein thrombosis (DVT) (11/26/2015), Hypertension, Melanoma in situ (HCC), Obesity, Pneumonia, and Sleep apnea (It is still in question, not fully diagnosed)..   Chief Complaint: A bump on the left side of the neck, found today and tender to touch.  History of Present Illness:   Neck Lump. Patient presents with a newly discovered bump on the left side of the neck. Found today and reports tenderness upon touching. No progression noted since its discovery. Denies any recent fevers, chills, vomiting, or diarrhea. States having runny nose and cough daily due to allergies but no new symptoms. Reports a history of paraganglioma on the right side of the neck which was not tender. Denies night sweats but notes waking up feeling warm due to environmental factors. Discussed the importance of ruling out head or neck cancer due to former smoking history and history of paraganglioma.  Patient nephew recently deceased due to suicide; is causing emotional and familial distress.  Empathy expressed.  ROS  Past Surgical History:  Procedure Laterality Date   ABDOMINAL HYSTERECTOMY  1994   APPENDECTOMY  1980   CESAREAN SECTION  1984   CESAREAN SECTION  1988   EYE SURGERY  2020 & 2022   torn retina right eye/ detached retina  left eye   EYE SURGERY  12/2021   Detached retina left eye, bubble & hole, belt and freeze   HERNIA REPAIR  1988   MELANOMA EXCISION     OVARIAN CYST SURGERY  1980   RETINAL DETACHMENT SURGERY Left 12/2020   THORACOTOMY  2001   TONSILLECTOMY  1972   VEIN LIGATION AND STRIPPING     1996, 2006 &  2014    Outpatient Medications Prior to Visit  Medication Sig Dispense Refill   aspirin EC 81 MG tablet Take 81 mg by mouth daily.     atorvastatin (LIPITOR) 10 MG tablet Take 10 mg by mouth daily.     cetirizine (ZYRTEC) 10 MG chewable tablet Chew 10 mg by mouth daily.     fluticasone (FLONASE) 50 MCG/ACT nasal spray Place 2  sprays into both nostrils daily. 16 g 3   hydrochlorothiazide (HYDRODIURIL) 12.5 MG tablet Take 2 tablets (25 mg total) by mouth daily. (Patient taking differently: Take 25 mg by mouth daily. Reports taking 1 daily.) 180 tablet 3   levothyroxine (SYNTHROID) 25 MCG tablet Take 1 tablet (25 mcg total) by mouth daily before breakfast. 90 tablet 3   Multiple Vitamin (MULTIVITAMIN) tablet Take 1 tablet by mouth daily.     NON FORMULARY Pt taking 1 Golo a day     Probiotic Product (PROBIOTIC PO) Take by mouth.     No facility-administered medications prior to visit.    Family History  Problem Relation Age of Onset   Stroke Mother    Arthritis Mother    Obesity Mother    Varicose Veins Mother    Bone cancer Father    Heart attack Father    Cancer Father    Heart disease Father    Hypertension Sister    Hypertension Brother    Colon polyps Brother    Stomach cancer Brother    Cancer Brother    Obesity Brother    Diabetes Maternal Grandmother    Arthritis Maternal Grandmother    Obesity Maternal Grandmother    Diabetes Maternal Grandfather    Diabetes Paternal Grandmother    Cancer Maternal Aunt    Cancer Maternal Aunt    Obesity Maternal Aunt    Obesity Son    Obesity Daughter    Varicose Veins Daughter    Colon cancer Neg Hx    Esophageal cancer Neg Hx    Rectal cancer Neg Hx     Social History   Socioeconomic History   Marital status: Married    Spouse name: Not on file   Number of children: 2   Years of education: Not on file   Highest education level: Associate degree: academic program  Occupational History   Occupation: retired  Tobacco Use   Smoking status: Former   Smokeless tobacco: Not on file   Tobacco comments:    Mostly just social on and off  for all the years I smoked.  Vaping Use   Vaping status: Never Used  Substance and Sexual Activity   Alcohol use: Yes    Alcohol/week: 1.0 - 2.0 standard drink of alcohol    Types: 1 - 2 Standard drinks or  equivalent per week    Comment: occasional   Drug use: No   Sexual activity: Yes    Birth control/protection: Other-see comments, None  Other Topics Concern   Not on file  Social History Narrative   Not on file   Social Determinants of Health   Financial Resource Strain: Low Risk  (06/20/2022)   Overall Financial Resource Strain (CARDIA)    Difficulty of Paying Living Expenses: Not hard at all  Food Insecurity: No Food Insecurity (06/20/2022)   Hunger Vital Sign    Worried About Running Out of Food in the Last Year: Never true    Ran Out of Food in the Last Year: Never true  Transportation Needs: No Transportation  Needs (06/20/2022)   PRAPARE - Administrator, Civil Service (Medical): No    Lack of Transportation (Non-Medical): No  Physical Activity: Sufficiently Active (06/20/2022)   Exercise Vital Sign    Days of Exercise per Week: 3 days    Minutes of Exercise per Session: 120 min  Stress: No Stress Concern Present (06/20/2022)   [employer] of Occupational Health - Occupational Stress Questionnaire    Feeling of Stress : Not at all  Social Connections: Unknown (06/20/2022)   Social Connection and Isolation Panel [NHANES]    Frequency of Communication with Friends and Family: Never    Frequency of Social Gatherings with Friends and Family: Once a week    Attends Religious Services: Patient declined    Database administrator or Organizations: No    Attends Engineer, structural: Not on file    Marital Status: Married  Intimate Partner Violence: Unknown (04/27/2021)   Received from [facility]   HITS    Physically Hurt: Not on file    Insult or Talk Down To: Not on file    Threaten Physical Harm: Not on file    Scream or Curse: Not on file                                                                                                  Objective:  Physical Exam: BP (!) 152/82 (BP Location: Right Arm, Patient Position: Sitting, Cuff Size: Large)    Pulse 79   Temp 97.9 F (36.6 C) (Temporal)   Wt 210 lb (95.3 kg)   SpO2 99%   BMI 33.89 kg/m     Physical Exam Constitutional:      General: She is not in acute distress.    Appearance: Normal appearance. She is not ill-appearing or toxic-appearing.  HENT:     Head: Normocephalic and atraumatic.     Nose: Nose normal. No congestion.  Eyes:     General: No scleral icterus.    Extraocular Movements: Extraocular movements intact.  Cardiovascular:     Rate and Rhythm: Normal rate and regular rhythm.     Pulses: Normal pulses.     Heart sounds: Normal heart sounds.  Pulmonary:     Effort: Pulmonary effort is normal. No respiratory distress.     Breath sounds: Normal breath sounds.  Abdominal:     General: Abdomen is flat. Bowel sounds are normal.     Palpations: Abdomen is soft.  Musculoskeletal:        General: Normal range of motion.  Lymphadenopathy:     Cervical: Cervical adenopathy present.     Left cervical: Posterior cervical adenopathy (Tender, mobile) present.  Skin:    General: Skin is warm and dry.     Findings: No rash.  Neurological:     General: No focal deficit present.     Mental Status: She is alert and oriented to person, place, and time. Mental status is at baseline.  Psychiatric:        Mood and Affect: Mood normal.  Behavior: Behavior normal.        Thought Content: Thought content normal.        Judgment: Judgment normal.     No results found.  Recent Results (from the past 2160 hour(s))  Thyroid Panel With TSH     Status: Abnormal   Collection Time: 09/21/22 11:04 AM  Result Value Ref Range   T3 Uptake 28 22 - 35 %   T4, Total 5.4 5.1 - 11.9 mcg/dL   Free Thyroxine Index 1.5 1.4 - 3.8   TSH 5.60 (H) 0.40 - 4.50 mIU/L        Garner Nash, MD, MS

## 2022-10-10 LAB — BASIC METABOLIC PANEL
BUN: 12 mg/dL (ref 6–23)
CO2: 32 meq/L (ref 19–32)
Calcium: 9.5 mg/dL (ref 8.4–10.5)
Chloride: 99 meq/L (ref 96–112)
Creatinine, Ser: 0.75 mg/dL (ref 0.40–1.20)
GFR: 84.39 mL/min (ref >60.00)
Glucose, Bld: 94 mg/dL (ref 70–99)
Potassium: 3.8 meq/L (ref 3.5–5.1)
Sodium: 139 meq/L (ref 135–145)

## 2022-10-10 LAB — CBC WITH DIFFERENTIAL/PLATELET
Basophils Absolute: 0.1 10*3/uL (ref 0.0–0.1)
Basophils Relative: 1.2 % (ref 0.0–3.0)
Eosinophils Absolute: 0.2 10*3/uL (ref 0.0–0.7)
Eosinophils Relative: 3.4 % (ref 0.0–5.0)
HCT: 41.3 % (ref 36.0–46.0)
Hemoglobin: 13.5 g/dL (ref 12.0–15.0)
Lymphocytes Relative: 28.1 % (ref 12.0–46.0)
Lymphs Abs: 1.7 10*3/uL (ref 0.7–4.0)
MCHC: 32.7 g/dL (ref 30.0–36.0)
MCV: 92.9 fl (ref 78.0–100.0)
Monocytes Absolute: 0.6 10*3/uL (ref 0.1–1.0)
Monocytes Relative: 9.5 % (ref 3.0–12.0)
Neutro Abs: 3.5 10*3/uL (ref 1.4–7.7)
Neutrophils Relative %: 57.8 % (ref 43.0–77.0)
Platelets: 284 10*3/uL (ref 150.0–400.0)
RBC: 4.45 Mil/uL (ref 3.87–5.11)
RDW: 13.6 % (ref 11.5–15.5)
WBC: 6 10*3/uL (ref 4.0–10.5)

## 2022-10-10 LAB — C-REACTIVE PROTEIN: CRP: <1 mg/dL (ref 0.5–20.0)

## 2022-10-10 LAB — SEDIMENTATION RATE: Sed Rate: 19 mm/h (ref 0–30)

## 2022-10-16 ENCOUNTER — Ambulatory Visit (HOSPITAL_BASED_OUTPATIENT_CLINIC_OR_DEPARTMENT_OTHER)
Admission: RE | Admit: 2022-10-16 | Discharge: 2022-10-16 | Disposition: A | Discharge status: Home / Self Care | Payer: BC Managed Care – PPO | Source: Ambulatory Visit | Attending: Family Medicine | Admitting: Family Medicine

## 2022-10-16 DIAGNOSIS — R221 Localized swelling, mass and lump, neck: Secondary | ICD-10-CM | POA: Diagnosis present

## 2022-10-16 MED ORDER — IOHEXOL 300 MG/ML  SOLN
75.0000 mL | Freq: Once | INTRAMUSCULAR | Status: AC | PRN
  Administered 2022-10-16: 75 mL via INTRAVENOUS

## 2022-10-24 ENCOUNTER — Encounter: Payer: Self-pay | Admitting: Family Medicine

## 2022-11-16 ENCOUNTER — Encounter: Payer: Self-pay | Admitting: Family Medicine

## 2022-11-16 ENCOUNTER — Ambulatory Visit (INDEPENDENT_AMBULATORY_CARE_PROVIDER_SITE_OTHER): Payer: BC Managed Care – PPO | Admitting: Family Medicine

## 2022-11-16 VITALS — BP 126/80 | HR 70 | Temp 97.9°F | Wt 210.8 lb

## 2022-11-16 DIAGNOSIS — R590 Localized enlarged lymph nodes: Secondary | ICD-10-CM | POA: Insufficient documentation

## 2022-11-16 DIAGNOSIS — I10 Essential (primary) hypertension: Secondary | ICD-10-CM

## 2022-11-16 DIAGNOSIS — Z23 Encounter for immunization: Secondary | ICD-10-CM

## 2022-11-16 DIAGNOSIS — E038 Other specified hypothyroidism: Secondary | ICD-10-CM | POA: Diagnosis not present

## 2022-11-16 NOTE — Progress Notes (Signed)
Assessment/Plan:   Problem List Items Addressed This Visit       Cardiovascular and Mediastinum   Primary hypertension    Blood pressure remains well-controlled on hydrochlorothiazide. Plan: Continue hydrochlorothiazide 12.5 mg daily.        Endocrine   Other specified hypothyroidism - Primary    The patient exhibits borderline thyroid function with improved symptoms on low-dose levothyroxine.  Plan: Continue levothyroxine 25 mcg daily. Recheck thyroid panel today. Follow up in 6 months.      Relevant Orders   Thyroid Panel With TSH     Immune and Lymphatic   Reactive cervical lymphadenopathy, left posterior neck    The lump has reduced in size and tenderness, likely reactive. Plan: Monitor the lymph node for changes. Consider ENT referral if the lump persists or enlarges. Follow-Up Recommendations: Immediate return if the lump increases in size or pain returns.      Other Visit Diagnoses     Immunization due       Relevant Orders   Flu vaccine trivalent PF, 6mos and older(Flulaval,Afluria,Fluarix,Fluzone) (Completed)       There are no discontinued medications.  Return in about 6 months (around 05/17/2023) for BP, thyroid, neck lymph node.    Subjective:   Encounter date: 11/16/2022  Leah Todd is a 62 y.o. female who has Actinic keratosis; Hyperlipidemia; Conjunctivitis; Bone disorder; Obesity; Chest pain; History of maternal deep vein thrombosis (DVT); Pain in left lower leg; Paraganglioma (HCC); Diaphragm paralysis; Right flank pain; Other specified hypothyroidism; History of nephrolithiasis; Viral URI with cough; Chronic right-sided low back pain without sciatica; Primary hypertension; Pain in joints; Chronic rhinitis; Spider bite; Vaccine counseling; Neck mass; and Reactive cervical lymphadenopathy, left posterior neck on their problem list..   She  has a past medical history of Actinic keratosis, Allergy (1980), Anemia (1984), Arthritis (2020),  Bloating, Bone disorder, Cancer (HCC), Cataract (2022), Conjunctivitis unspecified, Constipation, Elevated cholesterol, History of maternal deep vein thrombosis (DVT) (11/26/2015), Hypertension, Melanoma in situ (HCC), Obesity, Pneumonia, and Sleep apnea (It is still in question, not fully diagnosed)..   Chief Complaint: Follow-up visit for thyroid management and evaluation of neck lump (lymph node).  History of Present Illness:   For hypothyroidism, the patient was last seen in August, during which levothyroxine was held to reassess thyroid function. The thyroid levels have demonstrated borderline results. Two months ago, the patient reported symptoms of fatigue, thus levothyroxine was restarted at a low dose of 25 micrograms daily. The patient reports increased activity levels and feeling more energetic since the reintroduction of levothyroxine.  The neck lump initially enlarged but has since reduced in size and is no longer tender. No obvious infection was noted by the patient that might have triggered the lymph node swelling.    Past Surgical History:  Procedure Laterality Date   ABDOMINAL HYSTERECTOMY  1994   APPENDECTOMY  1980   CESAREAN SECTION  1984   CESAREAN SECTION  1988   EYE SURGERY  2020 & 2022   torn retina right eye/ detached retina  left eye   EYE SURGERY  12/2021   Detached retina left eye, bubble & hole, belt and freeze   HERNIA REPAIR  1988   MELANOMA EXCISION     OVARIAN CYST SURGERY  1980   RETINAL DETACHMENT SURGERY Left 12/2020   THORACOTOMY  2001   TONSILLECTOMY  1972   VEIN LIGATION AND STRIPPING     1996, 2006 & 2014    Outpatient Medications Prior  to Visit  Medication Sig Dispense Refill   aspirin EC 81 MG tablet Take 81 mg by mouth daily.     atorvastatin (LIPITOR) 10 MG tablet Take 10 mg by mouth daily.     cetirizine (ZYRTEC) 10 MG chewable tablet Chew 10 mg by mouth daily.     fluticasone (FLONASE) 50 MCG/ACT nasal spray Place 2 sprays into both  nostrils daily. 16 g 3   hydrochlorothiazide (HYDRODIURIL) 12.5 MG tablet Take 2 tablets (25 mg total) by mouth daily. (Patient taking differently: Take 25 mg by mouth daily. Reports taking 1 daily.) 180 tablet 3   levothyroxine (SYNTHROID) 25 MCG tablet Take 1 tablet (25 mcg total) by mouth daily before breakfast. 90 tablet 3   Multiple Vitamin (MULTIVITAMIN) tablet Take 1 tablet by mouth daily.     NON FORMULARY Pt taking 1 Golo a day     Probiotic Product (PROBIOTIC PO) Take by mouth.     No facility-administered medications prior to visit.    Family History  Problem Relation Age of Onset   Stroke Mother    Arthritis Mother    Obesity Mother    Varicose Veins Mother    Bone cancer Father    Heart attack Father    Cancer Father    Heart disease Father    Hypertension Sister    Hypertension Brother    Colon polyps Brother    Stomach cancer Brother    Cancer Brother    Obesity Brother    Diabetes Maternal Grandmother    Arthritis Maternal Grandmother    Obesity Maternal Grandmother    Diabetes Maternal Grandfather    Diabetes Paternal Grandmother    Cancer Maternal Aunt    Cancer Maternal Aunt    Obesity Maternal Aunt    Obesity Son    Obesity Daughter    Varicose Veins Daughter    Colon cancer Neg Hx    Esophageal cancer Neg Hx    Rectal cancer Neg Hx     Social History   Socioeconomic History   Marital status: Married    Spouse name: Not on file   Number of children: 2   Years of education: Not on file   Highest education level: Associate degree: occupational, Scientist, product/process development, or vocational program  Occupational History   Occupation: retired  Tobacco Use   Smoking status: Former   Smokeless tobacco: Not on file   Tobacco comments:    Mostly just social on and off  for all the years I smoked.  Vaping Use   Vaping status: Never Used  Substance and Sexual Activity   Alcohol use: Yes    Alcohol/week: 1.0 - 2.0 standard drink of alcohol    Types: 1 - 2 Standard  drinks or equivalent per week    Comment: occasional   Drug use: No   Sexual activity: Yes    Birth control/protection: Other-see comments, None  Other Topics Concern   Not on file  Social History Narrative   Not on file   Social Determinants of Health   Financial Resource Strain: Low Risk  (11/15/2022)   Overall Financial Resource Strain (CARDIA)    Difficulty of Paying Living Expenses: Not hard at all  Food Insecurity: No Food Insecurity (11/15/2022)   Hunger Vital Sign    Worried About Running Out of Food in the Last Year: Never true    Ran Out of Food in the Last Year: Never true  Transportation Needs: No Transportation Needs (11/15/2022)  PRAPARE - Administrator, Civil Service (Medical): No    Lack of Transportation (Non-Medical): No  Physical Activity: Sufficiently Active (11/15/2022)   Exercise Vital Sign    Days of Exercise per Week: 3 days    Minutes of Exercise per Session: 80 min  Stress: No Stress Concern Present (11/15/2022)   [employer] of Occupational Health - Occupational Stress Questionnaire    Feeling of Stress : Not at all  Social Connections: Moderately Isolated (11/15/2022)   Social Connection and Isolation Panel [NHANES]    Frequency of Communication with Friends and Family: Never    Frequency of Social Gatherings with Friends and Family: Three times a week    Attends Religious Services: Never    Active Member of Clubs or Organizations: No    Attends Banker Meetings: Not on file    Marital Status: Married  Intimate Partner Violence: Unknown (04/27/2021)   Received from [employer], [facility]   HITS    Physically Hurt: Not on file    Insult or Talk Down To: Not on file    Threaten Physical Harm: Not on file    Scream or Curse: Not on file                                                                                                  Objective:  Physical Exam: BP 126/80 (BP Location: Left Arm, Patient  Position: Sitting, Cuff Size: Large)   Pulse 70   Temp 97.9 F (36.6 C) (Temporal)   Wt 210 lb 12.8 oz (95.6 kg)   SpO2 99%   BMI 34.02 kg/m   Wt Readings from Last 3 Encounters:  11/16/22 210 lb 12.8 oz (95.6 kg)  10/09/22 210 lb (95.3 kg)  09/21/22 211 lb 6.4 oz (95.9 kg)     Physical Exam Constitutional:      General: She is not in acute distress.    Appearance: Normal appearance. She is not ill-appearing or toxic-appearing.  HENT:     Head: Normocephalic and atraumatic.     Nose: Nose normal. No congestion.  Eyes:     General: No scleral icterus.    Extraocular Movements: Extraocular movements intact.  Cardiovascular:     Rate and Rhythm: Normal rate and regular rhythm.     Pulses: Normal pulses.     Heart sounds: Normal heart sounds.  Pulmonary:     Effort: Pulmonary effort is normal. No respiratory distress.     Breath sounds: Normal breath sounds.  Abdominal:     General: Abdomen is flat. Bowel sounds are normal.     Palpations: Abdomen is soft.  Musculoskeletal:        General: Normal range of motion.     Cervical back: Full passive range of motion without pain and normal range of motion.  Lymphadenopathy:     Cervical: No cervical adenopathy.  Skin:    General: Skin is warm and dry.     Findings: No rash.  Neurological:     General: No focal deficit present.     Mental Status:  She is alert and oriented to person, place, and time. Mental status is at baseline.  Psychiatric:        Mood and Affect: Mood normal.        Behavior: Behavior normal.        Thought Content: Thought content normal.        Judgment: Judgment normal.     CT SOFT TISSUE NECK W CONTRAST  Result Date: 10/24/2022 CLINICAL DATA:  Neck mass EXAM: CT NECK WITH CONTRAST TECHNIQUE: Multidetector CT imaging of the neck was performed using the standard protocol following the bolus administration of intravenous contrast. RADIATION DOSE REDUCTION: This exam was performed according to the  departmental dose-optimization program which includes automated exposure control, adjustment of the mA and/or kV according to patient size and/or use of iterative reconstruction technique. CONTRAST:  75mL OMNIPAQUE IOHEXOL 300 MG/ML  SOLN COMPARISON:  None Available. FINDINGS: Pharynx and larynx: The nasal cavity and nasopharynx are unremarkable The oral cavity and oropharynx are unremarkable. The parapharyngeal spaces are clear. The hypopharynx and larynx are unremarkable. The vocal folds are normal in appearance. The epiglottis is normal. There is no retropharyngeal collection. The airway is patent. Salivary glands: The parotid and submandibular glands are unremarkable. Thyroid: Unremarkable. Lymph nodes: There is a 7 mm short axis left posterior triangle lymph node which is in the subcutaneous fat in the region of the radiopaque marker and may account for the reported palpable abnormality. This node is not pathologically enlarged by size criteria and maintains normal reniform morphology and fatty hilum. There is no pathologically enlarged or morphologically suspicious lymph node in the neck. Vascular: The major vasculature of the neck is unremarkable. Limited intracranial: Unremarkable. Visualized orbits: A left scleral buckle is noted. The globes and orbits are otherwise unremarkable. Mastoids and visualized paranasal sinuses: Clear. Skeleton: There is no acute osseous abnormality or suspicious osseous lesion. There is mild degenerative change at C4-C5 through C6-C7. Upper chest: Subpleural nodular opacity in the left lung apex is unchanged since 2014, requiring no specific imaging follow-up. Other: None. IMPRESSION: 7 mm short axis left posterior triangle lymph node likely accounts for the reported palpable abnormality, favored benign/reactive. Recommend clinical follow-up, with consideration of repeat imaging or tissue sampling if this lesion grows. Electronically Signed   By: Lesia Hausen M.D.   On:  10/24/2022 16:20    Recent Results (from the past 2160 hour(s))  Thyroid Panel With TSH     Status: Abnormal   Collection Time: 09/21/22 11:04 AM  Result Value Ref Range   T3 Uptake 28 22 - 35 %   T4, Total 5.4 5.1 - 11.9 mcg/dL   Free Thyroxine Index 1.5 1.4 - 3.8   TSH 5.60 (H) 0.40 - 4.50 mIU/L  CBC w/Diff     Status: None   Collection Time: 10/09/22  4:24 PM  Result Value Ref Range   WBC 6.0 4.0 - 10.5 K/uL   RBC 4.45 3.87 - 5.11 Mil/uL   Hemoglobin 13.5 12.0 - 15.0 g/dL   HCT 40.9 81.1 - 91.4 %   MCV 92.9 78.0 - 100.0 fl   MCHC 32.7 30.0 - 36.0 g/dL   RDW 78.2 95.6 - 21.3 %   Platelets 284.0 150.0 - 400.0 K/uL   Neutrophils Relative % 57.8 43.0 - 77.0 %   Lymphocytes Relative 28.1 12.0 - 46.0 %   Monocytes Relative 9.5 3.0 - 12.0 %   Eosinophils Relative 3.4 0.0 - 5.0 %   Basophils Relative 1.2 0.0 -  3.0 %   Neutro Abs 3.5 1.4 - 7.7 K/uL   Lymphs Abs 1.7 0.7 - 4.0 K/uL   Monocytes Absolute 0.6 0.1 - 1.0 K/uL   Eosinophils Absolute 0.2 0.0 - 0.7 K/uL   Basophils Absolute 0.1 0.0 - 0.1 K/uL  Basic Metabolic Panel (BMET)     Status: None   Collection Time: 10/09/22  4:24 PM  Result Value Ref Range   Sodium 139 135 - 145 mEq/L   Potassium 3.8 3.5 - 5.1 mEq/L   Chloride 99 96 - 112 mEq/L   CO2 32 19 - 32 mEq/L   Glucose, Bld 94 70 - 99 mg/dL   BUN 12 6 - 23 mg/dL   Creatinine, Ser 1.61 0.40 - 1.20 mg/dL   GFR 09.60 >45.40 mL/min    Comment: Calculated using the CKD-EPI Creatinine Equation (2021)   Calcium 9.5 8.4 - 10.5 mg/dL  C-reactive protein     Status: None   Collection Time: 10/09/22  4:24 PM  Result Value Ref Range   CRP <1.0 0.5 - 20.0 mg/dL  Sedimentation rate     Status: None   Collection Time: 10/09/22  4:24 PM  Result Value Ref Range   Sed Rate 19 0 - 30 mm/hr        Garner Nash, MD, MS

## 2022-11-16 NOTE — Patient Instructions (Signed)
Continue taking Levothyroxine 25 micrograms daily for thyroid management. Keep taking Hydrochlorothiazide 12.5 milligrams daily for blood pressure control. Schedule a follow-up appointment in six months to recheck thyroid levels and overall health. Monitor the neck lymph node for any changes and follow up sooner if it becomes noticeable again. Receive a flu shot today and consider the shingles vaccine at a later time

## 2022-11-17 LAB — THYROID PANEL WITH TSH
Free Thyroxine Index: 1.7 (ref 1.4–3.8)
T3 Uptake: 29 % (ref 22–35)
T4, Total: 5.8 ug/dL (ref 5.1–11.9)
TSH: 3.18 m[IU]/L (ref 0.40–4.50)

## 2022-11-17 NOTE — Assessment & Plan Note (Signed)
The lump has reduced in size and tenderness, likely reactive. Plan: Monitor the lymph node for changes. Consider ENT referral if the lump persists or enlarges. Follow-Up Recommendations: Immediate return if the lump increases in size or pain returns.

## 2022-11-17 NOTE — Assessment & Plan Note (Signed)
Blood pressure remains well-controlled on hydrochlorothiazide. Plan: Continue hydrochlorothiazide 12.5 mg daily.

## 2022-11-17 NOTE — Assessment & Plan Note (Signed)
The patient exhibits borderline thyroid function with improved symptoms on low-dose levothyroxine.  Plan: Continue levothyroxine 25 mcg daily. Recheck thyroid panel today. Follow up in 6 months.

## 2022-12-06 LAB — HM MAMMOGRAPHY

## 2023-02-02 ENCOUNTER — Encounter: Payer: Self-pay | Admitting: Family Medicine

## 2023-02-05 NOTE — Telephone Encounter (Signed)
 Reached out to [facility] to advise them of updated dx code from lab on 06/22/2022. Dx code updated to hypothyroidism E03.9. Spoke with [place] and she resubmitted code and it should show in 30-45 days.

## 2023-03-05 ENCOUNTER — Encounter: Payer: Self-pay | Admitting: Family Medicine

## 2023-03-05 ENCOUNTER — Ambulatory Visit: Payer: BC Managed Care – PPO | Admitting: Family Medicine

## 2023-03-05 VITALS — BP 139/78 | HR 61 | Temp 96.5°F | Resp 18 | Wt 215.4 lb

## 2023-03-05 DIAGNOSIS — Z23 Encounter for immunization: Secondary | ICD-10-CM | POA: Diagnosis not present

## 2023-03-05 DIAGNOSIS — M7732 Calcaneal spur, left foot: Secondary | ICD-10-CM

## 2023-03-05 DIAGNOSIS — M79662 Pain in left lower leg: Secondary | ICD-10-CM

## 2023-03-05 NOTE — Patient Instructions (Addendum)
 VISIT SUMMARY:  Today, we discussed your ongoing left leg pain, which has been increasing in intensity and frequency over the past year. We also reviewed your hypertension, hyperlipidemia, and hypothyroidism management, and discussed general health maintenance, including the shingles vaccination.  YOUR PLAN:  -LEFT LEG PAIN: You have chronic severe achy pain in your left leg, which may be related to your gait or foot alignment. We will get x-rays of your left ankle, foot, and knee to check for joint arthritis or malalignment. You should continue using your current topical treatments and Tylenol as needed. We will also refer you to sports medicine for further evaluation and gait assessment.  -HYPERTENSION: Your blood pressure is well-managed with your current medication. Continue taking your blood pressure medication at 12.5 mg daily.  -HYPERLIPIDEMIA: Your cholesterol levels are managed with atorvastatin. Continue taking atorvastatin 10 mg daily.  -HYPOTHYROIDISM: Your thyroid  function is managed with levothyroxine . Continue taking levothyroxine  25 mcg daily.  -GENERAL HEALTH MAINTENANCE: You are due for a shingles vaccination. This vaccine can help prevent shingles, which is especially important given your history of severe chickenpox. You may experience mild achiness after the vaccination, which can be managed with Tylenol.  INSTRUCTIONS:  Please complete the x-rays of your left ankle, foot, and knee at your convenience. Follow up with sports medicine for further evaluation and management.  For  xray, go to:    [place] [facility] [address] [place], [place], [place] [address] Phone: [phone]

## 2023-03-05 NOTE — Progress Notes (Signed)
 Assessment/Plan:   Assessment and Plan    Left Leg Pain Chronic severe achy pain in the left leg, localized under the ankle and extending to the knee, with nocturnal exacerbation disrupting sleep. No clear triggering event or injury. Partial relief with aspirin cream with lidocaine . Likely musculoskeletal, possibly related to gait mechanics or foot alignment. Differential includes joint arthritis, malalignment, or other musculoskeletal disorders. Discussed x-rays to evaluate for joint arthritis or malalignment. Potential treatments include arch support, physical therapy, or custom orthotics. Sports medicine referral for further evaluation and gait assessment. Prefers non-oral pain management. - Order x-ray of left ankle and foot - Order x-ray of left knee - Refer to sports medicine for further evaluation and gait assessment - Continue using topical treatments and Tylenol as needed  Hypertension Well-managed with 12.5 mg of an unspecified antihypertensive medication. Blood pressure today is 139/78 mmHg. - Continue current blood pressure medication at 12.5 mg daily  Hyperlipidemia Managed with atorvastatin 10 mg daily. She has a year's supply of the medication. - Continue atorvastatin 10 mg daily  Hypothyroidism Managed with levothyroxine  25 mcg daily. - Continue levothyroxine  25 mcg daily  General Health Maintenance Due for shingles vaccination. Discussed mild achiness post-vaccination, manageable with Tylenol. Emphasized vaccine benefits over shingles, especially given her severe chickenpox history. - Administer shingles vaccine - Use Tylenol for any post-vaccination discomfort  Follow-up - Complete x-rays of left ankle, foot, and knee at convenience - Follow up with sports medicine for further evaluation and management.        There are no discontinued medications.      Subjective:   Encounter date: 03/05/2023  Leah Todd is a 63 y.o. female who has Actinic  keratosis; Hyperlipidemia; Conjunctivitis; Bone disorder; Obesity; Chest pain; History of maternal deep vein thrombosis (DVT); Pain in left lower leg; Paraganglioma (HCC); Diaphragm paralysis; Right flank pain; Acquired hypothyroidism; History of nephrolithiasis; Viral URI with cough; Chronic right-sided low back pain without sciatica; Primary hypertension; Pain in joints; Chronic rhinitis; Spider bite; Vaccine counseling; Neck mass; and Reactive cervical lymphadenopathy, left posterior neck on their problem list..   She  has a past medical history of Actinic keratosis, Allergy (1980), Anemia (1984), Arthritis (2020), Bloating, Bone disorder, Cancer (HCC), Cataract (2022), Conjunctivitis unspecified, Constipation, Elevated cholesterol, History of maternal deep vein thrombosis (DVT) (11/26/2015), Hypertension, Melanoma in situ (HCC), Obesity, Pneumonia, and Sleep apnea (It is still in question, not fully diagnosed)..   She presents with chief complaint of Acute Visit (PT C/O of left leg pain for 1 year. Pt used Tyenol for pain but voiced it doesn't help much. PT is requesting US  imaging; shingles vaccine is due ) .   History of Present Illness   Leah Todd is a 63 year old female who presents with ongoing left leg pain.  She has been experiencing ongoing pain in her left leg, primarily located under the ankle and extending up to the knee. The pain is described as a severe ache, with a sensation of muscle twisting. Occasionally, the pain extends to the knee and hip, but not the hip joint, and the muscles tighten into the buttock area. The pain has been increasing in intensity and frequency over the past year. No specific injury or triggering event has been identified.  For pain relief, she applies aspirin cream with lidocaine , which she finds effective. She has tried a generic version of Oterin without success. She takes Tylenol at night to aid sleep but avoids it during the day due  to drowsiness. She  is unsure if she takes regular or extra strength Tylenol.  She has experimented with different shoes and insoles, noting that her leg cramps have decreased since using insoles recommended by a shoe store, which have also straightened her toes. She recalls wearing [employer] sandals as a child, which required her to grip her toes to keep them on, and wonders if this has affected her feet. She notes that her left leg appears shorter than the right, which may affect her gait. She sometimes experiences weakness in her leg, requiring her to hold onto the railing when using stairs.  No pain when moving the foot today and no knee pain when pressure is applied. The pain does not increase with sheets touching her leg, but movement can cause discomfort.  Her grandmother and mother had knee and hip problems.       ROS  Past Surgical History:  Procedure Laterality Date   ABDOMINAL HYSTERECTOMY  1994   APPENDECTOMY  1980   CESAREAN SECTION  1984   CESAREAN SECTION  1988   EYE SURGERY  2020 & 2022   torn retina right eye/ detached retina  left eye   EYE SURGERY  12/2021   Detached retina left eye, bubble & hole, belt and freeze   HERNIA REPAIR  1988   MELANOMA EXCISION     OVARIAN CYST SURGERY  1980   RETINAL DETACHMENT SURGERY Left 12/2020   THORACOTOMY  2001   TONSILLECTOMY  1972   VEIN LIGATION AND STRIPPING     1996, 2006 & 2014    Outpatient Medications Prior to Visit  Medication Sig Dispense Refill   aspirin EC 81 MG tablet Take 81 mg by mouth daily.     atorvastatin (LIPITOR) 10 MG tablet Take 10 mg by mouth daily.     cetirizine (ZYRTEC) 10 MG chewable tablet Chew 10 mg by mouth daily.     fluticasone  (FLONASE ) 50 MCG/ACT nasal spray Place 2 sprays into both nostrils daily. 16 g 3   hydrochlorothiazide  (HYDRODIURIL ) 12.5 MG tablet Take 2 tablets (25 mg total) by mouth daily. (Patient taking differently: Take 25 mg by mouth daily. Reports taking 1 daily.) 180 tablet 3    levothyroxine  (SYNTHROID ) 25 MCG tablet Take 1 tablet (25 mcg total) by mouth daily before breakfast. 90 tablet 3   Multiple Vitamin (MULTIVITAMIN) tablet Take 1 tablet by mouth daily.     NON FORMULARY Pt taking 1 Golo a day     Probiotic Product (PROBIOTIC PO) Take by mouth.     No facility-administered medications prior to visit.    Family History  Problem Relation Age of Onset   Stroke Mother    Arthritis Mother    Obesity Mother    Varicose Veins Mother    Bone cancer Father    Heart attack Father    Cancer Father    Heart disease Father    Hypertension Sister    Hypertension Brother    Colon polyps Brother    Stomach cancer Brother    Cancer Brother    Obesity Brother    Diabetes Maternal Grandmother    Arthritis Maternal Grandmother    Obesity Maternal Grandmother    Diabetes Maternal Grandfather    Diabetes Paternal Grandmother    Cancer Maternal Aunt    Cancer Maternal Aunt    Obesity Maternal Aunt    Obesity Son    Obesity Daughter    Varicose Veins Daughter  Colon cancer Neg Hx    Esophageal cancer Neg Hx    Rectal cancer Neg Hx     Social History   Socioeconomic History   Marital status: Married    Spouse name: Not on file   Number of children: 2   Years of education: Not on file   Highest education level: Associate degree: occupational, Scientist, product/process development, or vocational program  Occupational History   Occupation: retired  Tobacco Use   Smoking status: Former   Smokeless tobacco: Not on file   Tobacco comments:    Mostly just social on and off  for all the years I smoked.  Vaping Use   Vaping status: Never Used  Substance and Sexual Activity   Alcohol use: Yes    Alcohol/week: 1.0 - 2.0 standard drink of alcohol    Types: 1 - 2 Standard drinks or equivalent per week    Comment: occasional   Drug use: No   Sexual activity: Yes    Birth control/protection: Other-see comments, None  Other Topics Concern   Not on file  Social History Narrative    Not on file   Social Drivers of Health   Financial Resource Strain: Low Risk  (03/01/2023)   Overall Financial Resource Strain (CARDIA)    Difficulty of Paying Living Expenses: Not hard at all  Food Insecurity: No Food Insecurity (03/01/2023)   Hunger Vital Sign    Worried About Running Out of Food in the Last Year: Never true    Ran Out of Food in the Last Year: Never true  Transportation Needs: No Transportation Needs (03/01/2023)   PRAPARE - Administrator, Civil Service (Medical): No    Lack of Transportation (Non-Medical): No  Physical Activity: Insufficiently Active (03/01/2023)   Exercise Vital Sign    Days of Exercise per Week: 1 day    Minutes of Exercise per Session: 100 min  Stress: No Stress Concern Present (03/01/2023)   [employer] of Occupational Health - Occupational Stress Questionnaire    Feeling of Stress : Not at all  Social Connections: Socially Isolated (03/01/2023)   Social Connection and Isolation Panel [NHANES]    Frequency of Communication with Friends and Family: Never    Frequency of Social Gatherings with Friends and Family: Once a week    Attends Religious Services: Never    Database administrator or Organizations: No    Attends Engineer, structural: Not on file    Marital Status: Married  Intimate Partner Violence: Unknown (04/27/2021)   Received from [employer], [facility]   HITS    Physically Hurt: Not on file    Insult or Talk Down To: Not on file    Threaten Physical Harm: Not on file    Scream or Curse: Not on file                                                                                                  Objective:  Physical Exam: BP 139/78 (BP Location: Left Arm, Patient Position: Sitting, Cuff Size: Large)   Pulse 61  Temp (!) 96.5 F (35.8 C) (Temporal)   Resp 18   Wt 215 lb 6.4 oz (97.7 kg)   SpO2 97%   BMI 34.77 kg/m     Physical Exam Constitutional:      General: She is not in acute distress.     Appearance: Normal appearance. She is not ill-appearing or toxic-appearing.  HENT:     Head: Normocephalic and atraumatic.     Nose: Nose normal. No congestion.  Eyes:     General: No scleral icterus.    Extraocular Movements: Extraocular movements intact.  Cardiovascular:     Rate and Rhythm: Normal rate and regular rhythm.     Pulses: Normal pulses.     Heart sounds: Normal heart sounds.  Pulmonary:     Effort: Pulmonary effort is normal. No respiratory distress.     Breath sounds: Normal breath sounds.  Abdominal:     General: Abdomen is flat. Bowel sounds are normal.     Palpations: Abdomen is soft.  Musculoskeletal:        General: Normal range of motion.     Left knee: No swelling, deformity or crepitus. Normal range of motion. No tenderness. Normal alignment, normal meniscus and normal patellar mobility.     Left lower leg: Normal.     Right ankle:     Right Achilles Tendon: No tenderness.     Left ankle: No tenderness. Normal range of motion. Anterior drawer test negative. Normal pulse.     Left Achilles Tendon: Normal.     Left foot: Normal range of motion and normal capillary refill. No swelling, deformity or tenderness. Normal pulse.  Lymphadenopathy:     Cervical: No cervical adenopathy.  Skin:    General: Skin is warm and dry.     Findings: No rash.  Neurological:     General: No focal deficit present.     Mental Status: She is alert and oriented to person, place, and time. Mental status is at baseline.  Psychiatric:        Mood and Affect: Mood normal.        Behavior: Behavior normal.        Thought Content: Thought content normal.        Judgment: Judgment normal.     No results found.  Recent Results (from the past 2160 hours)  HM MAMMOGRAPHY     Status: None   Collection Time: 12/06/22 12:00 AM  Result Value Ref Range   HM Mammogram 0-4 Bi-Rad 0-4 Bi-Rad, Self Reported Normal        Carnell Christian, MD, MS

## 2023-03-08 ENCOUNTER — Ambulatory Visit (INDEPENDENT_AMBULATORY_CARE_PROVIDER_SITE_OTHER)
Admission: RE | Admit: 2023-03-08 | Discharge: 2023-03-08 | Disposition: A | Discharge status: Home / Self Care | Payer: BC Managed Care – PPO | Source: Ambulatory Visit | Attending: Family Medicine

## 2023-03-08 DIAGNOSIS — M79662 Pain in left lower leg: Secondary | ICD-10-CM | POA: Diagnosis not present

## 2023-03-09 ENCOUNTER — Encounter: Payer: Self-pay | Admitting: Family Medicine

## 2023-03-09 DIAGNOSIS — M7732 Calcaneal spur, left foot: Secondary | ICD-10-CM | POA: Insufficient documentation

## 2023-03-12 ENCOUNTER — Ambulatory Visit: Payer: BC Managed Care – PPO | Admitting: Family Medicine

## 2023-03-12 VITALS — BP 141/70 | Ht 66.0 in | Wt 215.0 lb

## 2023-03-12 DIAGNOSIS — M79662 Pain in left lower leg: Secondary | ICD-10-CM

## 2023-03-12 NOTE — Patient Instructions (Addendum)
Your exam and x-rays are reassuring. You are describing some pain related to the anterior tibialis and peroneal muscles. This should improve with physical therapy. Do home exercises on days you don't go to therapy. Heat 15 minutes at a time 3-4 times a day as needed.  Ice after rehab. Aleve or ibuprofen only if needed. Follow up with me in 6 weeks for reevaluation.  [facility] in [address] [place], [place] [address] Phone: [phone]

## 2023-03-12 NOTE — Progress Notes (Unsigned)
PCP: Garnette Gunner, MD  SUBJECTIVE:   HPI:  Patient is a 63 y.o. female here with chief complaint of left lower leg pain. For the past year has had intermittent anterior shin pain with longer walks since she started using different shoe inserts which were recommended to her for cramping she was having in her right leg. She has also started having lateral ankle pain that is aching in nature and non radiating. No numbness or tingling in foot. No hip or knee pain. She does reports some diffuse lower back pain but does not feel that her lower leg pain is radiating from her lower back. Her pain resolves with longer periods of rest. No weakness in left foot. She has has had multiple varicose vein procedures, no other surgeries or injuries to the left leg.  Pertinent ROS were reviewed with the patient and found to be negative unless otherwise specified above in HPI.   PERTINENT  PMH / PSH / FH / SH:  Past Medical, Surgical, Social, and Family History Reviewed & Updated in the EMR.  Pertinent findings include:    Past Medical History:  Diagnosis Date   Actinic keratosis    Allergy 1980   rash from penicillin after surgery   Anemia 1984   when I was pregnant with my first child. No one ever said anything about it after that time.   Arthritis 2020   nerver really verified previous doctor said that what was wrong with my finger joint when I hurt it at work   Bloating    Bone disorder    Cancer [facility])    Cataract 2022   mild at the time   Conjunctivitis unspecified    Constipation    Elevated cholesterol    History of maternal deep vein thrombosis (DVT) 11/26/2015   Hypertension    Melanoma in situ (HCC)    Obesity    Pneumonia    Sleep apnea It is still in question, not fully diagnosed    Current Outpatient Medications on File Prior to Visit  Medication Sig Dispense Refill   aspirin EC 81 MG tablet Take 81 mg by mouth daily.     atorvastatin (LIPITOR) 10 MG tablet Take 10 mg by  mouth daily.     cetirizine (ZYRTEC) 10 MG chewable tablet Chew 10 mg by mouth daily.     fluticasone (FLONASE) 50 MCG/ACT nasal spray Place 2 sprays into both nostrils daily. 16 g 3   hydrochlorothiazide (HYDRODIURIL) 12.5 MG tablet Take 2 tablets (25 mg total) by mouth daily. (Patient taking differently: Take 25 mg by mouth daily. Reports taking 1 daily.) 180 tablet 3   levothyroxine (SYNTHROID) 25 MCG tablet Take 1 tablet (25 mcg total) by mouth daily before breakfast. 90 tablet 3   Multiple Vitamin (MULTIVITAMIN) tablet Take 1 tablet by mouth daily.     NON FORMULARY Pt taking 1 Golo a day     Probiotic Product (PROBIOTIC PO) Take by mouth.     No current facility-administered medications on file prior to visit.    Past Surgical History:  Procedure Laterality Date   ABDOMINAL HYSTERECTOMY  1994   APPENDECTOMY  1980   CESAREAN SECTION  1984   CESAREAN SECTION  1988   EYE SURGERY  2020 & 2022   torn retina right eye/ detached retina  left eye   EYE SURGERY  12/2021   Detached retina left eye, bubble & hole, belt and freeze   HERNIA REPAIR  1988   MELANOMA EXCISION     OVARIAN CYST SURGERY  1980   RETINAL DETACHMENT SURGERY Left 12/2020   THORACOTOMY  2001   TONSILLECTOMY  1972   VEIN LIGATION AND STRIPPING     1996, 2006 & 2014    Allergies  Allergen Reactions   Latex    Codeine    Erythromycin    Penicillins    Tetracyclines & Related     OBJECTIVE:  BP (!) 141/70   Ht 5\' 6"  (1.676 m)   Wt 215 lb (97.5 kg)   BMI 34.70 kg/m      MSK EXAM: Ankle/Foot, left: No visible erythema, swelling, ecchymosis, or bony deformity. No notable pes planus/cavus deformity. Transverse arch grossly intact; Normal eversion/inversion stress testing. Range of motion is full in all directions. Strength is 5/5 in all directions. No tenderness at the insertion/body/myotendinous junction of the Achilles tendon; Minimal peroneal tendon tenderness, no subluxation; No tenderness on posterior  aspects of lateral and medial malleolus; Stable lateral and medial ligaments; Talar dome non-tender; No plantar calcaneal tenderness; No tenderness over the navicular prominence or base of the 5th MT; No tenderness over cuboid; No tenderness at the distal metatarsals; No tenderness of calf. Able to walk 4 steps.  Provocative Testing:   - Anterior Drawer: NEG  - Talar Tilt: NEG  - Tib/Fib Squeeze Test: NEG; Calcaneal Squeeze Test: NEG  Knee, left:  Inspection was negative for erythema, ecchymosis, and effusion. No obvious bony abnormalities or signs of osteophyte development. Palpation yielded no asymmetric warmth; No joint line tenderness; No condyle tenderness; No patellar tenderness; No knee crepitus. Patellar and quadriceps tendons unremarkable, and no tenderness of the pes anserine bursa. No obvious Baker's cyst development. ROM normal in flexion (135 degrees) and extension (0 degrees). Strength 5/5 with knee flexion and extension. Neurovascularly intact bilaterally.   Provocative Testing:    - Patella:   - Patellar glide: Appropriate medial/lateral glide without apprehension - Cruciate Ligaments:   - Anterior Drawer/Lachman test: NEG - Posterior Drawer: NEG  - Collateral Ligaments:   - Varus/Valgus (MCL/LCL) Stress test at 0, 15d: NEG   - Apley's Compression/Distraction test: NEG  - Meniscus:   - McMurray's: NEG  X-rays: Left Knee 2/13 Mild chronic enthesopathic change at the quadriceps insertion on the patella.  L ankle and foot 2/13 1. Moderate plantar and mild to moderate posterior calcaneal heel spurs. 2. 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 Assessment & Plan Pain in left lower leg Chronic left lower leg pain for the past year. Started as anterior shin pani while walking, progressed to include lateral ankle pain. Wearing inserts in shoes. Exam significant for TTP peroneal tendons and peroneus longus,  otherwise FROM, strength 5/5 in all directions, stability normal. Review of x-rays of left knee, ankle, and foot unremarkable. We discussed that the etiology of her symptoms are likely multifactorial with her anterior shin pain c/w strain of tibialis anterior and lateral leg strain c/w peroneal tendonitis. Less c/w radiculopathy from lumbar spine, although could consider imaging in the future if no improvement. Recommend formal PT and follow up in 6 weeks.   Micah Noel MD PGY2 [facility]

## 2023-03-12 NOTE — Assessment & Plan Note (Signed)
Chronic left lower leg pain for the past year. Started as anterior shin pani while walking, progressed to include lateral ankle pain. Wearing inserts in shoes. Exam significant for TTP peroneal tendons and peroneus longus, otherwise FROM, strength 5/5 in all directions, stability normal. Review of x-rays of left knee, ankle, and foot unremarkable. We discussed that the etiology of her symptoms are likely multifactorial with her anterior shin pain c/w strain of tibialis anterior and lateral leg strain c/w peroneal tendonitis. Less c/w radiculopathy from lumbar spine, although could consider imaging in the future if no improvement. Recommend formal PT and follow up in 6 weeks.

## 2023-03-13 ENCOUNTER — Other Ambulatory Visit: Payer: Self-pay | Admitting: Family Medicine

## 2023-03-13 DIAGNOSIS — J310 Chronic rhinitis: Secondary | ICD-10-CM

## 2023-04-30 ENCOUNTER — Ambulatory Visit (INDEPENDENT_AMBULATORY_CARE_PROVIDER_SITE_OTHER): Admitting: Family Medicine

## 2023-04-30 ENCOUNTER — Encounter: Payer: Self-pay | Admitting: Family Medicine

## 2023-04-30 VITALS — BP 134/82 | HR 78 | Temp 97.6°F | Ht 66.0 in | Wt 211.6 lb

## 2023-04-30 DIAGNOSIS — J019 Acute sinusitis, unspecified: Secondary | ICD-10-CM

## 2023-04-30 MED ORDER — DM-GUAIFENESIN ER 30-600 MG PO TB12: 1.0000 | ORAL_TABLET | Freq: Two times a day (BID) | ORAL | Status: DC

## 2023-04-30 MED ORDER — LEVOFLOXACIN 500 MG PO TABS
500.0000 mg | ORAL_TABLET | Freq: Every day | ORAL | 0 refills | Status: AC
Start: 2023-04-30 — End: 2023-05-05

## 2023-04-30 NOTE — Patient Instructions (Signed)
 Take levofloxacin if not improving or worsening over the next 3 days  Please be sure to drink plenty of fluids.   You may take the following OTC medications to help with symptoms:  For cough, use  cough syrups or other cough suppressants.  For headache, sore throat, fevers, muscle aches, chills, other pain, take ibuprofen or tylenol  For congestion, use nasal sprays, decongestants, or antihistamines  Please follow up if no improvement.   Go to ED if you have severe chest pain, fevers, shortness of breath or other worrisome symptoms.

## 2023-04-30 NOTE — Progress Notes (Signed)
 Assessment/Plan:    Assessment & Plan Sinusitis Taji presents with sinus pressure, ear discomfort, and cough with brown and green mucus for ten days, showing improvement. No fever, chills, significant pain, wheezing, or dyspnea. Lungs are clear. Brownish mucus may be due to minor blood from coughing. Based on symptom duration and age, she qualifies for antibiotics, but watchful waiting is also an option. No current signs of bacterial infection warranting immediate antibiotics. Discussed potential use of levofloxacin if symptoms do not improve, considering her allergy history and previous antibiotic reactions. - Continue cetirizine 10 mg for allergies. - Continue fluticasone nasal spray. - Use saline rinses as needed. - Use acetaminophen for discomfort. - Consider dextromethorphan-guaifenesin for cough. - Prescribe levofloxacin 500 mg for five days as a backup, to be used if symptoms do not improve in a few days.  Shingles vaccination Marissa received the first dose of the shingles vaccine. Advised that it is a two-dose series and recommended completing the series due to her severe varicella history. She can receive the second dose at any location but should wait until she is feeling better. - Complete the shingles vaccine series when feeling better.       There are no discontinued medications.  Return if symptoms worsen or fail to improve.    Subjective:   Encounter date: 04/30/2023  IVANNA KOCAK is a 63 y.o. female who has Actinic keratosis; Hyperlipidemia; Conjunctivitis; Bone disorder; Obesity; Chest pain; History of maternal deep vein thrombosis (DVT); Pain in left lower leg; Paraganglioma (HCC); Diaphragm paralysis; Right flank pain; Acquired hypothyroidism; History of nephrolithiasis; Viral URI with cough; Chronic right-sided low back pain without sciatica; Primary hypertension; Pain in joints; Chronic rhinitis; Spider bite; Vaccine counseling; Neck mass; Reactive cervical  lymphadenopathy, left posterior neck; and Calcaneal spur of foot, left on their problem list..   She  has a past medical history of Actinic keratosis, Allergy (1980), Anemia (1984), Arthritis (2020), Bloating, Bone disorder, Cancer (HCC), Cataract (2022), Conjunctivitis unspecified, Constipation, Elevated cholesterol, History of maternal deep vein thrombosis (DVT) (11/26/2015), Hypertension, Melanoma in situ (HCC), Obesity, Pneumonia, and Sleep apnea (It is still in question, not fully diagnosed)..   She presents with chief complaint of sinus pressure (Sinus pressure, ear drainage, brown blood in mucus x 1.5 week. ) .   Discussed the use of AI scribe software for clinical note transcription with the patient, who gave verbal consent to proceed.  History of Present Illness DARIYAH GARDUNO is a 63 year old female who presents with sinus pressure and ear discomfort.  She has been experiencing sinus pressure and ear discomfort for approximately ten days. Initially, the symptoms were severe but are now improving. No significant pain, including dental pain, is present, but there is a sensation of ear popping. She recalls a possible fever while traveling from [place] a week ago Sunday, although she did not have a thermometer to confirm it.  She describes a cough producing brown, green, and occasionally pinkish mucus, which she finds concerning. No significant chest pain or shortness of breath is present, but she experiences occasional wheezing at night. No current sore throat is reported.  She manages her symptoms with cetirizine 10 mg for allergies and fluticasone nasal spray. She also took Tylenol for body aches. She has not used Mucinex DM but is considering it for her cough.  She has a history of penicillin allergy, which caused a rash, and mentions her mother had a severe reaction to penicillin. She also experienced hives with  other antibiotics and stomach upset with tetracycline. She has not taken  cephalosporins due to uncertainty about her reaction to them, but she has used clindamycin in the past.     ROS  Symptoms (persistent symptoms) meet clinical criteria for diagnosis of acute bacterial rhinosinusitis per IDSA and AAO-HNS standards.  Penicillin allergy influences antibiotic selection.     Outpatient Medications Prior to Visit  Medication Sig Dispense Refill   aspirin EC 81 MG tablet Take 81 mg by mouth daily.     atorvastatin (LIPITOR) 10 MG tablet Take 10 mg by mouth daily.     cetirizine (ZYRTEC) 10 MG chewable tablet Chew 10 mg by mouth daily.     fluticasone (FLONASE) 50 MCG/ACT nasal spray USE 2 SPRAYS IN BOTH NOSTRILS  DAILY 16 g 3   hydrochlorothiazide (HYDRODIURIL) 12.5 MG tablet Take 2 tablets (25 mg total) by mouth daily. (Patient taking differently: Take 25 mg by mouth daily. Reports taking 1 daily.) 180 tablet 3   levothyroxine (SYNTHROID) 25 MCG tablet Take 1 tablet (25 mcg total) by mouth daily before breakfast. 90 tablet 3   Multiple Vitamin (MULTIVITAMIN) tablet Take 1 tablet by mouth daily.     NON FORMULARY Pt taking 1 Golo a day     Probiotic Product (PROBIOTIC PO) Take by mouth.     No facility-administered medications prior to visit.    Family History  Problem Relation Age of Onset   Stroke Mother    Arthritis Mother    Obesity Mother    Varicose Veins Mother    Bone cancer Father    Heart attack Father    Cancer Father    Heart disease Father    Hypertension Sister    Hypertension Brother    Colon polyps Brother    Stomach cancer Brother    Cancer Brother    Obesity Brother    Diabetes Maternal Grandmother    Arthritis Maternal Grandmother    Obesity Maternal Grandmother    Diabetes Maternal Grandfather    Diabetes Paternal Grandmother    Cancer Maternal Aunt    Cancer Maternal Aunt    Obesity Maternal Aunt    Obesity Son    Obesity Daughter    Varicose Veins Daughter    Colon cancer Neg Hx    Esophageal cancer Neg Hx     Rectal cancer Neg Hx     Social History   Socioeconomic History   Marital status: Married    Spouse name: Not on file   Number of children: 2   Years of education: Not on file   Highest education level: Associate degree: occupational, Scientist, product/process development, or vocational program  Occupational History   Occupation: retired  Tobacco Use   Smoking status: Former   Smokeless tobacco: Not on file   Tobacco comments:    Mostly just social on and off  for all the years I smoked.  Vaping Use   Vaping status: Never Used  Substance and Sexual Activity   Alcohol use: Yes    Alcohol/week: 1.0 - 2.0 standard drink of alcohol    Types: 1 - 2 Standard drinks or equivalent per week    Comment: occasional   Drug use: No   Sexual activity: Yes    Birth control/protection: Other-see comments, None  Other Topics Concern   Not on file  Social History Narrative   Not on file   Social Drivers of Health   Financial Resource Strain: Low Risk  (03/01/2023)   Overall  Financial Resource Strain (CARDIA)    Difficulty of Paying Living Expenses: Not hard at all  Food Insecurity: No Food Insecurity (03/01/2023)   Hunger Vital Sign    Worried About Running Out of Food in the Last Year: Never true    Ran Out of Food in the Last Year: Never true  Transportation Needs: No Transportation Needs (03/01/2023)   PRAPARE - Administrator, Civil Service (Medical): No    Lack of Transportation (Non-Medical): No  Physical Activity: Insufficiently Active (03/01/2023)   Exercise Vital Sign    Days of Exercise per Week: 1 day    Minutes of Exercise per Session: 100 min  Stress: No Stress Concern Present (03/01/2023)   [employer] of Occupational Health - Occupational Stress Questionnaire    Feeling of Stress : Not at all  Social Connections: Socially Isolated (03/01/2023)   Social Connection and Isolation Panel [NHANES]    Frequency of Communication with Friends and Family: Never    Frequency of Social Gatherings  with Friends and Family: Once a week    Attends Religious Services: Never    Database administrator or Organizations: No    Attends Engineer, structural: Not on file    Marital Status: Married  Intimate Partner Violence: Unknown (04/27/2021)   Received from [employer], [facility]   HITS    Physically Hurt: Not on file    Insult or Talk Down To: Not on file    Threaten Physical Harm: Not on file    Scream or Curse: Not on file                                                                                                  Objective:  Physical Exam: BP 134/82   Pulse 78   Temp 97.6 F (36.4 C) (Temporal)   Ht 5\' 6"  (1.676 m)   Wt 211 lb 9.6 oz (96 kg)   SpO2 92%   BMI 34.15 kg/m    Physical Exam GENERAL: Alert, cooperative, well developed, no acute distress HEENT: Normocephalic, normal oropharynx, moist mucous membranes, tympanic membranes normal, no fluid, sinuses normal NECK: No cervical adenopathy CHEST: Clear to auscultation bilaterally, no wheezes, rhonchi, or crackles CARDIOVASCULAR: Normal heart rate and rhythm, S1 and S2 normal without murmurs ABDOMEN: Soft, non-tender, non-distended, without organomegaly, normal bowel sounds EXTREMITIES: No cyanosis or edema NEUROLOGICAL: Cranial nerves grossly intact, moves all extremities without gross motor or sensory deficit   Physical Exam  DG Knee Complete 4 Views Left Result Date: 03/08/2023 CLINICAL DATA:  Left lower leg pain. Knee pain intermittently over 1 year. EXAM: LEFT KNEE - COMPLETE 4+ VIEW COMPARISON:  None Available. FINDINGS: Mild chronic enthesopathic change at the quadriceps insertion on the patella. No joint effusion. Joint spaces are preserved. No acute fracture or dislocation. IMPRESSION: Mild chronic enthesopathic change at the quadriceps insertion on the patella. Electronically Signed   By: Neita Garnet M.D.   On: 03/08/2023 17:35   DG Foot 2 Views Left Result Date: 03/08/2023 CLINICAL DATA:   Chronic left ankle and foot pain.  EXAM: LEFT FOOT - 2 VIEW; LEFT ANKLE COMPLETE - 3+ VIEW COMPARISON:  None Available. FINDINGS: Left ankle: The ankle mortise is symmetric and intact. Moderate plantar and mild to moderate posterior calcaneal heel spurs. Mild dorsal talar dome-neck degenerative spurring. No acute fracture or dislocation. No soft tissue swelling. 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 No acute fracture or dislocation. IMPRESSION: 1. Moderate plantar and mild to moderate posterior calcaneal heel spurs. 2. 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 Electronically Signed   By: Neita Garnet M.D.   On: 03/08/2023 17:34   DG Ankle Complete Left Result Date: 03/08/2023 CLINICAL DATA:  Chronic left ankle and foot pain. EXAM: LEFT FOOT - 2 VIEW; LEFT ANKLE COMPLETE - 3+ VIEW COMPARISON:  None Available. FINDINGS: Left ankle: The ankle mortise is symmetric and intact. Moderate plantar and mild to moderate posterior calcaneal heel spurs. Mild dorsal talar dome-neck degenerative spurring. No acute fracture or dislocation. No soft tissue swelling. 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 No acute fracture or dislocation. IMPRESSION: 1. Moderate plantar and mild to moderate posterior calcaneal heel spurs. 2. 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 Electronically Signed   By: Neita Garnet M.D.   On: 03/08/2023 17:34    No results found for this or any previous visit (from the past 2160 hours).      Garner Nash, MD, MS

## 2023-05-07 ENCOUNTER — Ambulatory Visit (INDEPENDENT_AMBULATORY_CARE_PROVIDER_SITE_OTHER): Admitting: Family Medicine

## 2023-05-07 ENCOUNTER — Encounter: Payer: Self-pay | Admitting: Family Medicine

## 2023-05-07 VITALS — BP 126/82 | HR 77 | Temp 97.5°F | Wt 211.2 lb

## 2023-05-07 DIAGNOSIS — Z8619 Personal history of other infectious and parasitic diseases: Secondary | ICD-10-CM

## 2023-05-07 DIAGNOSIS — J019 Acute sinusitis, unspecified: Secondary | ICD-10-CM

## 2023-05-07 DIAGNOSIS — E039 Hypothyroidism, unspecified: Secondary | ICD-10-CM | POA: Diagnosis not present

## 2023-05-07 DIAGNOSIS — M25561 Pain in right knee: Secondary | ICD-10-CM

## 2023-05-07 MED ORDER — MELOXICAM 15 MG PO TABS: 15.0000 mg | ORAL_TABLET | Freq: Every day | ORAL | 0 refills | Status: DC

## 2023-05-07 NOTE — Patient Instructions (Addendum)
 VISIT SUMMARY:  Today, you were seen for right knee pain and swelling that has worsened over the past week. We discussed your symptoms, potential causes, and management strategies. We also reviewed your history of sinus infections, thyroid disorder, and measles immunity.  YOUR PLAN:  -RIGHT KNEE PAIN WITH SUSPECTED MENISCAL INJURY: Your right knee pain and swelling suggest a possible meniscal injury, which is a tear in the cartilage of the knee. We will refer you to an orthopedic specialist for further evaluation. For pain management, you can continue using Tylenol and Aspercreme. We also discussed using meloxicam, a stronger pain reliever, for long car trips. Additionally, consider using a knee brace for stability and support hose during travel. Intermittent use of diclofenac topical rubs like Aspercreme is also recommended.  -SINUS INFECTION: Your sinus infection symptoms have significantly improved. No further treatment is necessary at this time.  -THYROID DISORDER: You are on a low dose of thyroid medication and have not experienced significant changes in symptoms. Illness can affect thyroid lab results, so we suggest delaying thyroid function tests until you have fully recovered from any illness.  -MEASLES IMMUNITY: You had measles twice in childhood, which likely means you are immune for life. There is no current concern for measles given your history.  -GENERAL HEALTH MAINTENANCE: We discussed the use of Sambucol for colds. Short-term use at the onset of symptoms is likely safe, but it is important to use FDA-approved medications for safety and efficacy. We also reviewed your vaccination history and noted that you did not receive the childhood measles vaccination but have a history of measles infections.

## 2023-05-07 NOTE — Progress Notes (Signed)
 Assessment/Plan:   Problem List Items Addressed This Visit   None   Assessment and Plan Assessment & Plan Right knee pain with suspected meniscal injury Worsening right knee pain over the past week, with swelling and aching in the entire leg, exacerbated by activities such as stair navigation. Symptoms suggest a meniscal injury, similar to her husband's previous condition. An MRI is indicated for confirmation, and orthopedic referral is necessary for further management. Currently managing pain with Tylenol and Aspercreme, considering stronger pain relief for an upcoming long car trip. Discussed meloxicam for pain management, a once-daily NSAID to be taken with food, and advised on knee brace use for stability, though data on effectiveness is mixed. Emphasized consulting orthopedics for potential minimally invasive treatments like arthroscopy. - Refer to orthopedics for evaluation of suspected meniscal injury - Prescribe meloxicam 15 mg as needed for pain, to be taken with food - Advise use of Tylenol as needed for pain - Discuss the use of a knee brace for stability - Advise on the use of support hose during travel - Recommend intermittent use of diclofenac topical rubs such as Aspercreme  Sinus infection Significant improvement in sinus infection symptoms.  Thyroid disorder On a low dose of thyroid medication with no significant changes in symptoms such as fatigue. Illness can affect thyroid lab results; suggest delaying thyroid function tests until post-recovery from any illness. - Delay thyroid function tests until after recovery from any illness  Measles immunity Had measles twice in childhood, inquiring about immunity. Most adults with a history of measles are immune for life, with no current concern for measles given her history.  General Health Maintenance Inquired about Sambucol for colds. Advised short-term use at symptom onset is likely safe, but emphasized FDA-approved  medications for safety and efficacy. Discussed vaccination history, noting lack of childhood measles vaccination but history of measles infections. - Advise short-term use of Sambucol at the onset of cold symptoms - Discuss the importance of FDA-approved medications for safety and efficacy      There are no discontinued medications.  No follow-ups on file.    Subjective:   Encounter date: 05/07/2023  [place] Leah Todd is a 63 y.o. female who has Actinic keratosis; Hyperlipidemia; Conjunctivitis; Bone disorder; Obesity; Chest pain; History of maternal deep vein thrombosis (DVT); Pain in left lower leg; Paraganglioma (HCC); Diaphragm paralysis; Right flank pain; Acquired hypothyroidism; History of nephrolithiasis; Viral URI with cough; Chronic right-sided low back pain without sciatica; Primary hypertension; Pain in joints; Chronic rhinitis; Spider bite; Vaccine counseling; Neck mass; Reactive cervical lymphadenopathy, left posterior neck; and Calcaneal spur of foot, left on their problem list..   She  has a past medical history of Actinic keratosis, Allergy (1980), Anemia (1984), Arthritis (2020), Bloating, Bone disorder, Cancer (HCC), Cataract (2022), Conjunctivitis unspecified, Constipation, Elevated cholesterol, History of maternal deep vein thrombosis (DVT) (11/26/2015), Hypertension, Melanoma in situ (HCC), Obesity, Pneumonia, and Sleep apnea (It is still in question, not fully diagnosed)..   She presents with chief complaint of Leg Pain (Right leg and knee pain on the right side of leg and swelling  x 1 week. ) .   Discussed the use of AI scribe software for clinical note transcription with the patient, who gave verbal consent to proceed.  History of Present Illness Leah Todd is a 63 year old female who presents with right knee pain and swelling.  She has been experiencing pain and swelling in her right knee, which has worsened over the past week.  The pain radiates to her entire  leg, particularly during activities such as ascending and descending stairs. The swelling is localized to specific areas of the knee, and she has been applying ice to manage it. She has not undergone any prior imaging studies for this knee. The pain is exacerbated by standing, walking on cold floors, and she needs to keep her foot straight when using stairs. She also has a habit of hitting her knee on the car's light dial, which causes additional pain.  For pain management, she takes Tylenol at night and uses Aspercreme topically. She avoids regular use of NSAIDs due to their sedative effects, which she wants to avoid during the day as she usually drives. She is considering using meloxicam for longer car rides, as she will be traveling soon.  Her husband had surgery on his leg in 2017, and she notes that her symptoms are similar to what he experienced. She is concerned about the possibility of a meniscal injury, as she recalls a twisting incident that caused pain.  She has a history of sinus infections, which have improved significantly. She inquires about the use of Sambucol for colds and its interaction with her thyroid medication, which she takes regularly.     ROS  Past Surgical History:  Procedure Laterality Date  . ABDOMINAL HYSTERECTOMY  1994  . APPENDECTOMY  1980  . CESAREAN SECTION  1984  . CESAREAN SECTION  1988  . EYE SURGERY  2020 & 2022   torn retina right eye/ detached retina  left eye  . EYE SURGERY  12/2021   Detached retina left eye, bubble & hole, belt and freeze  . HERNIA REPAIR  1988  . MELANOMA EXCISION    . OVARIAN CYST SURGERY  1980  . RETINAL DETACHMENT SURGERY Left 12/2020  . THORACOTOMY  2001  . TONSILLECTOMY  1972  . VEIN LIGATION AND STRIPPING     1996, 2006 & 2014    Outpatient Medications Prior to Visit  Medication Sig Dispense Refill  . aspirin EC 81 MG tablet Take 81 mg by mouth daily.    Marland Kitchen atorvastatin (LIPITOR) 10 MG tablet Take 10 mg by mouth  daily.    . cetirizine (ZYRTEC) 10 MG chewable tablet Chew 10 mg by mouth daily.    Marland Kitchen dextromethorphan-guaiFENesin (MUCINEX DM) 30-600 MG 12hr tablet Take 1 tablet by mouth 2 (two) times daily.    . fluticasone (FLONASE) 50 MCG/ACT nasal spray USE 2 SPRAYS IN BOTH NOSTRILS  DAILY 16 g 3  . hydrochlorothiazide (HYDRODIURIL) 12.5 MG tablet Take 2 tablets (25 mg total) by mouth daily. (Patient taking differently: Take 25 mg by mouth daily. Reports taking 1 daily.) 180 tablet 3  . levothyroxine (SYNTHROID) 25 MCG tablet Take 1 tablet (25 mcg total) by mouth daily before breakfast. 90 tablet 3  . Multiple Vitamin (MULTIVITAMIN) tablet Take 1 tablet by mouth daily.    . NON FORMULARY Pt taking 1 Golo a day    . Probiotic Product (PROBIOTIC PO) Take by mouth.     No facility-administered medications prior to visit.    Family History  Problem Relation Age of Onset  . Stroke Mother   . Arthritis Mother   . Obesity Mother   . Varicose Veins Mother   . Bone cancer Father   . Heart attack Father   . Cancer Father   . Heart disease Father   . Hypertension Sister   . Hypertension Brother   . Colon polyps Brother   .  Stomach cancer Brother   . Cancer Brother   . Obesity Brother   . Diabetes Maternal Grandmother   . Arthritis Maternal Grandmother   . Obesity Maternal Grandmother   . Diabetes Maternal Grandfather   . Diabetes Paternal Grandmother   . Cancer Maternal Aunt   . Cancer Maternal Aunt   . Obesity Maternal Aunt   . Obesity Son   . Obesity Daughter   . Varicose Veins Daughter   . Colon cancer Neg Hx   . Esophageal cancer Neg Hx   . Rectal cancer Neg Hx     Social History   Socioeconomic History  . Marital status: Married    Spouse name: Not on file  . Number of children: 2  . Years of education: Not on file  . Highest education level: Associate degree: occupational, Scientist, product/process development, or vocational program  Occupational History  . Occupation: retired  Tobacco Use  . Smoking  status: Former  . Smokeless tobacco: Not on file  . Tobacco comments:    Mostly just social on and off  for all the years I smoked.  Vaping Use  . Vaping status: Never Used  Substance and Sexual Activity  . Alcohol use: Yes    Alcohol/week: 1.0 - 2.0 standard drink of alcohol    Types: 1 - 2 Standard drinks or equivalent per week    Comment: occasional  . Drug use: No  . Sexual activity: Yes    Birth control/protection: Other-see comments, None  Other Topics Concern  . Not on file  Social History Narrative  . Not on file   Social Drivers of Health   Financial Resource Strain: Low Risk  (03/01/2023)   Overall Financial Resource Strain (CARDIA)   . Difficulty of Paying Living Expenses: Not hard at all  Food Insecurity: No Food Insecurity (03/01/2023)   Hunger Vital Sign   . Worried About Programme researcher, broadcasting/film/video in the Last Year: Never true   . Ran Out of Food in the Last Year: Never true  Transportation Needs: No Transportation Needs (03/01/2023)   PRAPARE - Transportation   . Lack of Transportation (Medical): No   . Lack of Transportation (Non-Medical): No  Physical Activity: Insufficiently Active (03/01/2023)   Exercise Vital Sign   . Days of Exercise per Week: 1 day   . Minutes of Exercise per Session: 100 min  Stress: No Stress Concern Present (03/01/2023)   [employer] of Occupational Health - Occupational Stress Questionnaire   . Feeling of Stress : Not at all  Social Connections: Socially Isolated (03/01/2023)   Social Connection and Isolation Panel [NHANES]   . Frequency of Communication with Friends and Family: Never   . Frequency of Social Gatherings with Friends and Family: Once a week   . Attends Religious Services: Never   . Active Member of Clubs or Organizations: No   . Attends Banker Meetings: Not on file   . Marital Status: Married  Catering manager Violence: Unknown (04/27/2021)   Received from [facility]   HITS   . Physically  Hurt: Not on file   . Insult or Talk Down To: Not on file   . Threaten Physical Harm: Not on file   . Scream or Curse: Not on file  Objective:  Physical Exam: BP 126/82   Pulse 77   Temp (!) 97.5 F (36.4 C) (Temporal)   Wt 211 lb 3.2 oz (95.8 kg)   SpO2 98%   BMI 34.09 kg/m    Physical Exam    Physical Exam  DG Knee Complete 4 Views Left Result Date: 03/08/2023 CLINICAL DATA:  Left lower leg pain. Knee pain intermittently over 1 year. EXAM: LEFT KNEE - COMPLETE 4+ VIEW COMPARISON:  None Available. FINDINGS: Mild chronic enthesopathic change at the quadriceps insertion on the patella. No joint effusion. Joint spaces are preserved. No acute fracture or dislocation. IMPRESSION: Mild chronic enthesopathic change at the quadriceps insertion on the patella. Electronically Signed   By: Bertina Broccoli M.D.   On: 03/08/2023 17:35   DG Foot 2 Views Left Result Date: 03/08/2023 CLINICAL DATA:  Chronic left ankle and foot pain. EXAM: LEFT FOOT - 2 VIEW; LEFT ANKLE COMPLETE - 3+ VIEW COMPARISON:  None Available. FINDINGS: Left ankle: The ankle mortise is symmetric and intact. Moderate plantar and mild to moderate posterior calcaneal heel spurs. Mild dorsal talar dome-neck degenerative spurring. No acute fracture or dislocation. No soft tissue swelling. 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 No acute fracture or dislocation. IMPRESSION: 1. Moderate plantar and mild to moderate posterior calcaneal heel spurs. 2. 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 Electronically Signed   By: Bertina Broccoli M.D.   On: 03/08/2023 17:34   DG Ankle Complete Left Result Date: 03/08/2023 CLINICAL DATA:  Chronic left ankle and foot pain. EXAM: LEFT FOOT - 2 VIEW; LEFT ANKLE COMPLETE -  3+ VIEW COMPARISON:  None Available. FINDINGS: Left ankle: The ankle mortise is symmetric and intact. Moderate plantar and mild to moderate posterior calcaneal heel spurs. Mild dorsal talar dome-neck degenerative spurring. No acute fracture or dislocation. No soft tissue swelling. 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 No acute fracture or dislocation. IMPRESSION: 1. Moderate plantar and mild to moderate posterior calcaneal heel spurs. 2. 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 Electronically Signed   By: Bertina Broccoli M.D.   On: 03/08/2023 17:34    No results found for this or any previous visit (from the past 2160 hours).      Carnell Christian, MD, MS

## 2023-05-17 ENCOUNTER — Other Ambulatory Visit (INDEPENDENT_AMBULATORY_CARE_PROVIDER_SITE_OTHER): Payer: Self-pay

## 2023-05-17 ENCOUNTER — Ambulatory Visit (INDEPENDENT_AMBULATORY_CARE_PROVIDER_SITE_OTHER): Admitting: Orthopaedic Surgery

## 2023-05-17 DIAGNOSIS — M25561 Pain in right knee: Secondary | ICD-10-CM

## 2023-05-17 NOTE — Progress Notes (Signed)
 Office Visit Note   Patient: Leah Todd           Date of Birth: Jan 15, 1961           MRN: [id number] Visit Date: 05/17/2023              Requested by: Catheryn Cluck, MD [address] [place],  [place] [address] PCP: Catheryn Cluck, MD   Assessment & Plan: Visit Diagnoses:  1. Acute pain of right knee     Plan: Patient is a 63 year old female with right knee pain.  Probable osteoarthritis flare.  She has noticed improvement with meloxicam  so she should complete the course.  Will also give her home strengthening program.  Follow-up as needed.  Follow-Up Instructions: No follow-ups on file.   Orders:  Orders Placed This Encounter  Procedures   XR KNEE 3 VIEW RIGHT   No orders of the defined types were placed in this encounter.     Procedures: No procedures performed   Clinical Data: No additional findings.   Subjective: Chief Complaint  Patient presents with   Right Knee - Pain    HPI Issabella is a 63 year old female with right knee pain for a few weeks.  Denies any injuries.  Pain radiates down the lateral side of the knee.  PCP gave her 15 mg meloxicam  which has helped but she has only been on it for a few days.  Denies any mechanical symptoms.  Calf pain at times.  Has tried backing off on activity recently. Review of Systems  Constitutional: Negative.   HENT: Negative.    Eyes: Negative.   Respiratory: Negative.    Cardiovascular: Negative.   Endocrine: Negative.   Musculoskeletal: Negative.   Neurological: Negative.   Hematological: Negative.   Psychiatric/Behavioral: Negative.    All other systems reviewed and are negative.    Objective: Vital Signs: There were no vitals taken for this visit.  Physical Exam Vitals and nursing note reviewed.  Constitutional:      Appearance: She is well-developed.  HENT:     Head: Atraumatic.     Nose: Nose normal.  Eyes:     Extraocular Movements: Extraocular movements intact.   Cardiovascular:     Pulses: Normal pulses.  Pulmonary:     Effort: Pulmonary effort is normal.  Abdominal:     Palpations: Abdomen is soft.  Musculoskeletal:     Cervical back: Neck supple.  Skin:    General: Skin is warm.     Capillary Refill: Capillary refill takes less than 2 seconds.  Neurological:     Mental Status: She is alert. Mental status is at baseline.  Psychiatric:        Behavior: Behavior normal.        Thought Content: Thought content normal.        Judgment: Judgment normal.     Ortho Exam Exam of the right knee is normal.  Normal range of motion. Specialty Comments:  No specialty comments available.  Imaging: XR KNEE 3 VIEW RIGHT Result Date: 05/17/2023 X-rays of the right knee show no significant generative joint disease.  A small superior patellar traction spur.    PMFS History: Patient Active Problem List   Diagnosis Date Noted   Calcaneal spur of foot, left 03/09/2023   Reactive cervical lymphadenopathy, left posterior neck 11/16/2022   Neck mass 10/09/2022   Vaccine counseling 09/21/2022   Chronic rhinitis 07/11/2022   Spider bite 07/11/2022   Chronic right-sided low  back pain without sciatica 06/22/2022   Primary hypertension 06/22/2022   Pain in joints 06/22/2022   Viral URI with cough 04/14/2022   Right flank pain 03/20/2022   Acquired hypothyroidism 03/20/2022   History of nephrolithiasis 03/20/2022   Pain in left lower leg 02/24/2022   Paraganglioma (HCC) 02/24/2022   Diaphragm paralysis 02/24/2022   History of maternal deep vein thrombosis (DVT) 11/26/2015   Chest pain 11/13/2012   Actinic keratosis    Hyperlipidemia    Conjunctivitis    Bone disorder    Obesity    Past Medical History:  Diagnosis Date   Actinic keratosis    Allergy 1980   rash from penicillin after surgery   Anemia 1984   when I was pregnant with my first child. No one ever said anything about it after that time.   Arthritis 2020   nerver really  verified previous doctor said that what was wrong with my finger joint when I hurt it at work   Bloating    Bone disorder    Cancer (HCC)    Cataract 2022   mild at the time   Conjunctivitis unspecified    Constipation    Elevated cholesterol    History of maternal deep vein thrombosis (DVT) 11/26/2015   Hypertension    Melanoma in situ (HCC)    Obesity    Pneumonia    Sleep apnea It is still in question, not fully diagnosed    Family History  Problem Relation Age of Onset   Stroke Mother    Arthritis Mother    Obesity Mother    Varicose Veins Mother    Bone cancer Father    Heart attack Father    Cancer Father    Heart disease Father    Hypertension Sister    Hypertension Brother    Colon polyps Brother    Stomach cancer Brother    Cancer Brother    Obesity Brother    Diabetes Maternal Grandmother    Arthritis Maternal Grandmother    Obesity Maternal Grandmother    Diabetes Maternal Grandfather    Diabetes Paternal Grandmother    Cancer Maternal Aunt    Cancer Maternal Aunt    Obesity Maternal Aunt    Obesity Son    Obesity Daughter    Varicose Veins Daughter    Colon cancer Neg Hx    Esophageal cancer Neg Hx    Rectal cancer Neg Hx     Past Surgical History:  Procedure Laterality Date   ABDOMINAL HYSTERECTOMY  1994   APPENDECTOMY  1980   CESAREAN SECTION  1984   CESAREAN SECTION  1988   EYE SURGERY  2020 & 2022   torn retina right eye/ detached retina  left eye   EYE SURGERY  12/2021   Detached retina left eye, bubble & hole, belt and freeze   HERNIA REPAIR  1988   MELANOMA EXCISION     OVARIAN CYST SURGERY  1980   RETINAL DETACHMENT SURGERY Left 12/2020   THORACOTOMY  2001   TONSILLECTOMY  1972   VEIN LIGATION AND STRIPPING     1996, 2006 & 2014   Social History   Occupational History   Occupation: retired  Tobacco Use   Smoking status: Former   Smokeless tobacco: Not on file   Tobacco comments:    Mostly just social on and off  for all the  years I smoked.  Vaping Use   Vaping status: Never Used  Substance  and Sexual Activity   Alcohol use: Yes    Alcohol/week: 1.0 - 2.0 standard drink of alcohol    Types: 1 - 2 Standard drinks or equivalent per week    Comment: occasional   Drug use: No   Sexual activity: Yes    Birth control/protection: Other-see comments, None

## 2023-06-20 ENCOUNTER — Ambulatory Visit: Admitting: Family Medicine

## 2023-06-21 ENCOUNTER — Encounter: Payer: Self-pay | Admitting: Family Medicine

## 2023-06-22 ENCOUNTER — Other Ambulatory Visit: Payer: Self-pay

## 2023-06-22 DIAGNOSIS — M25561 Pain in right knee: Secondary | ICD-10-CM

## 2023-06-22 MED ORDER — MELOXICAM 15 MG PO TABS
15.0000 mg | ORAL_TABLET | Freq: Every day | ORAL | 0 refills | Status: AC
Start: 2023-06-22 — End: ?

## 2023-07-09 ENCOUNTER — Other Ambulatory Visit: Payer: Self-pay | Admitting: Family Medicine

## 2023-07-09 DIAGNOSIS — E038 Other specified hypothyroidism: Secondary | ICD-10-CM

## 2023-07-31 ENCOUNTER — Other Ambulatory Visit: Payer: Self-pay | Admitting: Family Medicine

## 2023-07-31 DIAGNOSIS — I10 Essential (primary) hypertension: Secondary | ICD-10-CM

## 2023-11-05 ENCOUNTER — Ambulatory Visit: Payer: Self-pay

## 2023-11-05 NOTE — Telephone Encounter (Signed)
 FYI Only or Action Required?: Action required by provider: request for appointment.  Patient was last seen in primary care on 05/07/2023 by Leah Beverley NOVAK, MD.  Called Nurse Triage reporting Leg Pain.  Symptoms began several days ago.  Interventions attempted: Nothing.  Symptoms are: stable.  Triage Disposition: See PCP Within 2 Weeks  Patient/caregiver understands and will follow disposition?: YesCopied from CRM [id number]. Topic: Clinical - Red Word Triage >> Nov 05, 2023  9:11 AM [name] wrote: Patient had severe leg cramping and passed out on Thursday, requesting appt with Dr. Sebastian Reason for Disposition  Leg pain or muscle cramp is a chronic symptom (recurrent or ongoing AND present > 4 weeks)  Answer Assessment - Initial Assessment Questions Left leg cramping so intense pt passed out on Thursday. My husband said I was passed out for less than 30 secs. I was standing when it happened but he caught me. I cramps behind knee then travels whole back of leg.  Pt only wants to see PCP. Next appt is 10/17.      1. ONSET: When did the pain start?      Thursday 2. LOCATION: Where is the pain located?      Left  3. PAIN: How bad is the pain?    (Scale 1-10; or mild, moderate, severe)     0 4. WORK OR EXERCISE: Has there been any recent work or exercise that involved this part of the body?      denies 5. CAUSE: What do you think is causing the leg pain?     Not sure 6. OTHER SYMPTOMS: Do you have any other symptoms? (e.g., chest pain, back pain, breathing difficulty, swelling, rash, fever, numbness, weakness)     Fainting due to pain.  Protocols used: Leg Pain-A-AH

## 2023-11-09 ENCOUNTER — Encounter: Payer: Self-pay | Admitting: Family Medicine

## 2023-11-09 ENCOUNTER — Ambulatory Visit (INDEPENDENT_AMBULATORY_CARE_PROVIDER_SITE_OTHER): Admitting: Family Medicine

## 2023-11-09 VITALS — BP 133/72 | HR 66 | Temp 97.0°F | Resp 18 | Wt 209.8 lb

## 2023-11-09 DIAGNOSIS — E039 Hypothyroidism, unspecified: Secondary | ICD-10-CM

## 2023-11-09 DIAGNOSIS — R252 Cramp and spasm: Secondary | ICD-10-CM

## 2023-11-09 DIAGNOSIS — R55 Syncope and collapse: Secondary | ICD-10-CM

## 2023-11-09 DIAGNOSIS — E782 Mixed hyperlipidemia: Secondary | ICD-10-CM

## 2023-11-09 DIAGNOSIS — I10 Essential (primary) hypertension: Secondary | ICD-10-CM

## 2023-11-09 LAB — CBC WITH DIFFERENTIAL/PLATELET
Basophils Absolute: 0 K/uL (ref 0.0–0.1)
Basophils Relative: 0.6 % (ref 0.0–3.0)
Eosinophils Absolute: 0.2 K/uL (ref 0.0–0.7)
Eosinophils Relative: 4.2 % (ref 0.0–5.0)
HCT: 40.6 % (ref 36.0–46.0)
Hemoglobin: 13.5 g/dL (ref 12.0–15.0)
Lymphocytes Relative: 25.3 % (ref 12.0–46.0)
Lymphs Abs: 1.3 K/uL (ref 0.7–4.0)
MCHC: 33.3 g/dL (ref 30.0–36.0)
MCV: 90.8 fl (ref 78.0–100.0)
Monocytes Absolute: 0.5 K/uL (ref 0.1–1.0)
Monocytes Relative: 8.8 % (ref 3.0–12.0)
Neutro Abs: 3.2 K/uL (ref 1.4–7.7)
Neutrophils Relative %: 61.1 % (ref 43.0–77.0)
Platelets: 261 K/uL (ref 150.0–400.0)
RBC: 4.48 Mil/uL (ref 3.87–5.11)
RDW: 13.8 % (ref 11.5–15.5)
WBC: 5.2 K/uL (ref 4.0–10.5)

## 2023-11-09 LAB — MAGNESIUM: Magnesium: 2 mg/dL (ref 1.5–2.5)

## 2023-11-09 MED ORDER — ATORVASTATIN CALCIUM 10 MG PO TABS: 10.0000 mg | ORAL_TABLET | Freq: Every day | ORAL | 3 refills | Status: AC

## 2023-11-09 NOTE — Patient Instructions (Signed)
  VISIT SUMMARY: You visited us  today due to a recent episode of fainting and leg cramping. We discussed your symptoms, reviewed your medications, and planned further tests and specialist referrals to understand the cause of your symptoms better.  YOUR PLAN: SYNCOPE: You experienced a fainting episode after standing up from a reclined position, following leg cramping. -We will refer you to a cardiologist for further evaluation. -We will order blood tests to check your electrolytes, kidney function, thyroid  function, and liver function. -If you faint again, please go to the emergency department immediately.  BILATERAL LEG CRAMPING: You had cramping in both legs, which led to your fainting episode. This has happened before, but without fainting. -We will order blood tests to check your electrolytes, kidney function, thyroid  function, and liver function. -We will refer you to a podiatrist to evaluate your feet and consider orthotics. -Consider over-the-counter supplements like potassium, magnesium, B12 complex, and vitamin K2 for leg cramping.  HYPERTENSION: Your high blood pressure is well-controlled with your current medication. -Continue taking hydrochlorothiazide  12.5 mg daily. -Monitor your blood pressure regularly.  HYPERLIPIDEMIA: Your high cholesterol is managed with atorvastatin. -Continue taking atorvastatin 10 mg daily.  HYPOTHYROIDISM: Your low thyroid  function is managed with levothyroxine . -Continue taking levothyroxine  25 mcg daily. -We will order thyroid  function tests to monitor your condition.

## 2023-11-09 NOTE — Progress Notes (Signed)
 Assessment & Plan   Assessment/Plan:    Assessment & Plan Syncope Recent syncopal episode after standing from a reclined position, following bilateral leg cramping. Patient had a prior episode of syncope a few years ago associated with a worse cramp in the right leg. EKG and orthostatic blood pressure measurements are normal. Differential diagnosis includes cardiac causes, electrolyte imbalance, and medication effects. No seizure-like activity reported. - Refer to cardiologist for further evaluation - Order electrolyte panel including sodium, potassium, and magnesium - Order renal function tests including creatinine and BUN - Order thyroid  function tests with TSH reflex to T4 - Order liver function tests - Advise to go to the emergency department if syncope recurs  Bilateral leg cramping Bilateral leg cramping with recent episode in the left leg leading to syncope. Cramping has occurred in the past, with the worst episode in the right leg a few years ago. Potential causes include electrolyte imbalance, medication effects, or issues related to footwear and orthotics. She suspects foot-related issues. - Order electrolyte panel including sodium, potassium, and magnesium - Order renal function tests including creatinine and BUN - Order thyroid  function tests with TSH reflex to T4 - Order liver function tests - Refer to podiatrist for evaluation and potential orthotics - Discuss over-the-counter options such as potassium, magnesium, B12 complex, and vitamin K2 for leg cramping  Hypertension Mild hypertension, well-controlled with hydrochlorothiazide  12.5 mg daily. - Continue hydrochlorothiazide  12.5 mg daily - Monitor blood pressure regularly  Hyperlipidemia Hyperlipidemia managed with atorvastatin 10 mg daily. - Refill atorvastatin 10 mg daily  Hypothyroidism Hypothyroidism managed with levothyroxine  25 mcg daily. - Continue levothyroxine  25 mcg daily - Order thyroid  function tests  with TSH reflex to T4      Medications Discontinued During This Encounter  Medication Reason   dextromethorphan-guaiFENesin  (MUCINEX  DM) 30-600 MG 12hr tablet    atorvastatin (LIPITOR) 10 MG tablet Reorder    Return if symptoms worsen or fail to improve.        Subjective:   Encounter date: 11/09/2023  Leah Todd is a 63 y.o. female who has Actinic keratosis; Hyperlipidemia; Conjunctivitis; Bone disorder; Obesity; Chest pain; History of maternal deep vein thrombosis (DVT); Pain in left lower leg; Diaphragm paralysis; Right flank pain; Acquired hypothyroidism; History of nephrolithiasis; Viral URI with cough; Chronic right-sided low back pain without sciatica; Primary hypertension; Pain in joints; Chronic rhinitis; Spider bite; Vaccine counseling; Neck mass; Reactive cervical lymphadenopathy, left posterior neck; and Calcaneal spur of foot, left on their problem list..   She  has a past medical history of Actinic keratosis, Allergy (1980), Anemia (1984), Arthritis (2020), Bloating, Bone disorder, Cancer (HCC) (2000), Cataract (2022), Conjunctivitis unspecified, Constipation, Elevated cholesterol, History of maternal deep vein thrombosis (DVT) (11/26/2015), Hypertension (?), Melanoma in situ (HCC), Obesity, Pneumonia, and Sleep apnea (It is still in question, not fully diagnosed)..   She presents with chief complaint of leg cramping  (Pt c/o of left leg cramping for a few years; symptoms will occur behind the knee//HM due- vaccinations ) and synope .   Discussed the use of AI scribe software for clinical note transcription with the patient, who gave verbal consent to proceed.  History of Present Illness Leah Todd is a 63 year old female with hypertension, hypothyroidism, and hyperlipidemia who presents with a syncopal episode and bilateral leg cramping.  Syncope - Experienced a syncopal episode while sitting in a recliner - Legs began cramping while seated, prompting her to  stand up - After standing for approximately  one minute, lost consciousness - Husband observed her eyes were open but she was unresponsive during the episode - Estimated duration of unresponsiveness was a few seconds to a couple of minutes - No history of similar syncopal episodes - No seizure-like activity during the episode - No dizziness, chest pain, or shortness of breath preceding or during the event  Bilateral leg cramping - Bilateral leg cramping began while seated, most recently affecting the left leg - Cramping persisted after standing and made ambulation difficult - No residual soreness the following day, which is unusual for her - History of leg sensitivity due to previous injections and surgeries - Previous severe cramp in right leg a few years ago without associated syncope - No current leg cramps  Medication use and adherence - Current medications: hydrochlorothiazide  12.5 mg daily, meloxicam  15 mg daily as needed for pain, atorvastatin 10 mg daily, levothyroxine  25 mcg daily - Missed dose of atorvastatin the previous night     ROS  Past Surgical History:  Procedure Laterality Date   ABDOMINAL HYSTERECTOMY  1994   APPENDECTOMY  1980   CESAREAN SECTION  1984   CESAREAN SECTION  1988   EYE SURGERY  2020 & 2022   torn retina right eye/ detached retina  left eye   EYE SURGERY  2020 & 2022   Detached retina left eye, bubble & hole, belt and freeze   HERNIA REPAIR  1988   MELANOMA EXCISION     OVARIAN CYST SURGERY  1980   RETINAL DETACHMENT SURGERY Left 12/2020   THORACOTOMY  2001   TONSILLECTOMY  1972   VEIN LIGATION AND STRIPPING     1996, 2006 & 2014    Outpatient Medications Prior to Visit  Medication Sig Dispense Refill   aspirin EC 81 MG tablet Take 81 mg by mouth daily.     cetirizine (ZYRTEC) 10 MG chewable tablet Chew 10 mg by mouth daily.     fluticasone  (FLONASE ) 50 MCG/ACT nasal spray USE 2 SPRAYS IN BOTH NOSTRILS  DAILY 16 g 3    hydrochlorothiazide  (HYDRODIURIL ) 12.5 MG tablet TAKE 2 TABLETS BY MOUTH DAILY 180 tablet 3   levothyroxine  (SYNTHROID ) 25 MCG tablet TAKE 1 TABLET BY MOUTH DAILY  BEFORE BREAKFAST 90 tablet 3   meloxicam  (MOBIC ) 15 MG tablet Take 1 tablet (15 mg total) by mouth daily. 30 tablet 0   Multiple Vitamin (MULTIVITAMIN) tablet Take 1 tablet by mouth daily.     NON FORMULARY Pt taking 1 Golo a day     Probiotic Product (PROBIOTIC PO) Take by mouth.     atorvastatin (LIPITOR) 10 MG tablet Take 10 mg by mouth daily.     dextromethorphan-guaiFENesin  (MUCINEX  DM) 30-600 MG 12hr tablet Take 1 tablet by mouth 2 (two) times daily.     No facility-administered medications prior to visit.    Family History  Problem Relation Age of Onset   Stroke Mother    Arthritis Mother    Obesity Mother    Varicose Veins Mother    Bone cancer Father    Heart attack Father    Cancer Father    Heart disease Father    Hypertension Sister    Hypertension Brother    Colon polyps Brother    Stomach cancer Brother    Cancer Brother    Obesity Brother    Diabetes Maternal Grandmother    Arthritis Maternal Grandmother    Obesity Maternal Grandmother    Diabetes Maternal Grandfather  Diabetes Paternal Grandmother    Cancer Maternal Aunt    Cancer Maternal Aunt    Obesity Maternal Aunt    Obesity Son    Obesity Daughter    Varicose Veins Daughter    Colon cancer Neg Hx    Esophageal cancer Neg Hx    Rectal cancer Neg Hx     Social History   Socioeconomic History   Marital status: Married    Spouse name: Not on file   Number of children: 2   Years of education: Not on file   Highest education level: Associate degree: academic program  Occupational History   Occupation: retired  Tobacco Use   Smoking status: Former   Smokeless tobacco: Not on file   Tobacco comments:    Mostly just social on and off  for all the years I smoked.  Vaping Use   Vaping status: Never Used  Substance and Sexual  Activity   Alcohol use: Yes    Alcohol/week: 1.0 - 2.0 standard drink of alcohol    Types: 1 - 2 Standard drinks or equivalent per week    Comment: occasional   Drug use: No   Sexual activity: Yes    Birth control/protection: Other-see comments, None  Other Topics Concern   Not on file  Social History Narrative   Not on file   Social Drivers of Health   Financial Resource Strain: Low Risk  (11/06/2023)   Overall Financial Resource Strain (CARDIA)    Difficulty of Paying Living Expenses: Not very hard  Food Insecurity: No Food Insecurity (11/06/2023)   Hunger Vital Sign    Worried About Running Out of Food in the Last Year: Never true    Ran Out of Food in the Last Year: Never true  Transportation Needs: No Transportation Needs (11/06/2023)   PRAPARE - Administrator, Civil Service (Medical): No    Lack of Transportation (Non-Medical): No  Physical Activity: Insufficiently Active (11/06/2023)   Exercise Vital Sign    Days of Exercise per Week: 1 day    Minutes of Exercise per Session: 80 min  Stress: No Stress Concern Present (11/06/2023)   [employer] of Occupational Health - Occupational Stress Questionnaire    Feeling of Stress: Only a little  Social Connections: Socially Isolated (11/06/2023)   Social Connection and Isolation Panel    Frequency of Communication with Friends and Family: Once a week    Frequency of Social Gatherings with Friends and Family: Once a week    Attends Religious Services: Never    Database administrator or Organizations: No    Attends Engineer, structural: Not on file    Marital Status: Married  Intimate Partner Violence: Unknown (04/27/2021)   Received from [facility]   HITS    Physically Hurt: Not on file    Insult or Talk Down To: Not on file    Threaten Physical Harm: Not on file    Scream or Curse: Not on file  Objective:  Physical Exam: BP 133/72 (BP Location: Left Arm, Patient Position: Sitting, Cuff Size: Large)   Pulse 66   Temp (!) 97 F (36.1 C) (Temporal)   Resp 18   Wt 209 lb 12.8 oz (95.2 kg)   SpO2 97%   BMI 33.86 kg/m    Physical Exam GENERAL: Alert, cooperative, well developed, no acute distress. HEENT: Normocephalic, normal oropharynx, moist mucous membranes. CHEST: Clear to auscultation bilaterally, no wheezes, rhonchi, or crackles. CARDIOVASCULAR: Normal heart rate and rhythm, S1 and S2 normal without murmurs. ABDOMEN: Soft, non-tender, non-distended, without organomegaly, normal bowel sounds. EXTREMITIES: No cyanosis or edema. No lower extremity spasms, 5/5 strength in bilateral lower extremities NEUROLOGICAL: Cranial nerves grossly intact, moves all extremities without gross motor or sensory deficit.   Physical Exam  No results found.  No results found for this or any previous visit (from the past 2160 hours).      Beverley Adine Hummer, MD, MS

## 2023-11-10 LAB — TSH RFX ON ABNORMAL TO FREE T4: TSH: 5.24 u[IU]/mL — ABNORMAL HIGH (ref 0.450–4.500)

## 2023-11-10 LAB — T4F: T4,Free (Direct): 1.03 ng/dL (ref 0.82–1.77)

## 2023-11-11 LAB — COMPLETE METABOLIC PANEL WITHOUT GFR
AG Ratio: 1.8 (calc) (ref 1.0–2.5)
ALT: 15 U/L (ref 6–29)
AST: 19 U/L (ref 10–35)
Albumin: 4.9 g/dL (ref 3.6–5.1)
Alkaline phosphatase (APISO): 71 U/L (ref 37–153)
BUN: 10 mg/dL (ref 7–25)
CO2: 30 mmol/L (ref 20–32)
Calcium: 9.4 mg/dL (ref 8.6–10.4)
Chloride: 99 mmol/L (ref 98–110)
Creat: 0.68 mg/dL (ref 0.50–1.05)
Globulin: 2.7 g/dL (ref 1.9–3.7)
Glucose, Bld: 98 mg/dL (ref 65–99)
Potassium: 3.6 mmol/L (ref 3.5–5.3)
Sodium: 139 mmol/L (ref 135–146)
Total Bilirubin: 0.6 mg/dL (ref 0.2–1.2)
Total Protein: 7.6 g/dL (ref 6.1–8.1)

## 2023-11-11 LAB — URINALYSIS W MICROSCOPIC + REFLEX CULTURE
Bacteria, UA: NONE SEEN /HPF
Bilirubin Urine: NEGATIVE
Glucose, UA: NEGATIVE
Hgb urine dipstick: NEGATIVE
Hyaline Cast: NONE SEEN /LPF
Ketones, ur: NEGATIVE
Nitrites, Initial: NEGATIVE
Protein, ur: NEGATIVE
RBC / HPF: NONE SEEN /HPF (ref 0–2)
Specific Gravity, Urine: 1.005 (ref 1.001–1.035)
pH: 6.5 (ref 5.0–8.0)

## 2023-11-11 LAB — URINE CULTURE
MICRO NUMBER:: 17117841
Result:: NO GROWTH
SPECIMEN QUALITY:: ADEQUATE

## 2023-11-11 LAB — CULTURE INDICATED

## 2023-11-13 ENCOUNTER — Ambulatory Visit: Payer: Self-pay | Admitting: Family Medicine

## 2023-11-13 DIAGNOSIS — E039 Hypothyroidism, unspecified: Secondary | ICD-10-CM

## 2023-11-19 ENCOUNTER — Encounter: Payer: Self-pay | Admitting: Family Medicine

## 2023-11-19 ENCOUNTER — Other Ambulatory Visit: Payer: Self-pay

## 2023-11-19 DIAGNOSIS — J310 Chronic rhinitis: Secondary | ICD-10-CM

## 2023-11-19 MED ORDER — FLUTICASONE PROPIONATE 50 MCG/ACT NA SUSP: 2.0000 | Freq: Every day | NASAL | 1 refills | Status: AC

## 2023-11-21 NOTE — Addendum Note (Signed)
 Addended by: SEBASTIAN RIGHTER B on: 11/21/2023 02:13 PM   Modules accepted: Orders

## 2023-11-26 ENCOUNTER — Ambulatory Visit: Attending: Physician Assistant

## 2023-11-26 ENCOUNTER — Encounter: Payer: Self-pay | Admitting: Radiology

## 2023-11-26 ENCOUNTER — Ambulatory Visit: Attending: Physician Assistant | Admitting: Physician Assistant

## 2023-11-26 VITALS — BP 158/96 | HR 74 | Ht 66.5 in | Wt 212.4 lb

## 2023-11-26 DIAGNOSIS — R55 Syncope and collapse: Secondary | ICD-10-CM

## 2023-11-26 LAB — D-DIMER, QUANTITATIVE: D-DIMER: 0.52 mg{FEU}/L — ABNORMAL HIGH (ref 0.00–0.49)

## 2023-11-26 NOTE — Progress Notes (Unsigned)
 Cardiology Office Note   Date:  11/28/2023  ID:  Leah Todd, DOB May 11, 1960, MRN [id number] PCP: Sebastian Beverley NOVAK, MD  [facility] Providers Cardiologist:  None     History of Present Illness Leah Todd is a 63 y.o. 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  PFT in December 2023 was normal.  Patient has been followed by Dr. Annella of pulmonology service for evaluation of elevated bicarbonate.  Patient was recently seen by PCP on 11/09/2023 for syncopal episode.  She reportedly had a syncopal episode after standing from reclined position following bilateral leg cramping.  She also had prior episode of syncope several years ago.  EKG and orthostatic measurements were normal.  TSH elevated at 5.24.  Urinalysis showed 1+ leukocyte but negative nitrite or bacteria.  Normal red blood cell count.  Normal renal function and electrolyte.  Normal magnesium.  Normal free T4.  No gross in the urine culture.  Patient presents today for cardiology office evaluation of syncope.  The event happened while she was in [place]  on 11/02/2023.  She did not seek medical attention until she saw her PCP a week later.  She described the symptom as a flushing sensation, nausea, dizziness followed by 30 seconds of passing out spell.  She did not have any chest pain or shortness of breath.  Prior to the event, she did have left leg pain after sitting crosslegged.  She typically gets right leg cramps but never had left leg cramps in the past.  Will order a D-dimer to make sure she did not have PE.  Given the flushing sensation, most likely cause is vasovagal event.  I will obtain an echocardiogram and a 2-week [employer] monitor with hypoallergenic patch.  I plan to see the patient back in 6 to 8 weeks.  I did inform the  patient based on [place]  law, given syncope, she should not be driving for 6 months   ROS:   Patient had a single episode of syncope.  She denies any chest pain or shortness of breath.   Studies Reviewed          Risk Assessment/Calculations          Physical Exam VS:  BP (!) 158/96   Pulse 74   Ht 5' 6.5 (1.689 m)   Wt 212 lb 6.4 oz (96.3 kg)   SpO2 98%   BMI 33.77 kg/m        Wt Readings from Last 3 Encounters:  11/26/23 212 lb 6.4 oz (96.3 kg)  11/09/23 209 lb 12.8 oz (95.2 kg)  05/07/23 211 lb 3.2 oz (95.8 kg)    GEN: Well nourished, well developed in no acute distress NECK: No JVD; No carotid bruits CARDIAC: RRR, no murmurs, rubs, gallops RESPIRATORY:  Clear to auscultation without rales, wheezing or rhonchi  ABDOMEN: Soft, non-tender, non-distended EXTREMITIES:  No edema; No deformity   ASSESSMENT AND PLAN  Syncope: Blood pressure is elevated today, however normally her blood pressure is fairly controlled.  She had a episode of syncope 2 weeks ago.  Symptoms preceded by flushing sensation and nausea.  I suspect she had a vasovagal episode.  Obtain D-dimer to rule out PE as a potential cause.  I will obtain an echocardiogram and a 2-week [employer] heart monitor to further assess.  If both study came back normal, no further  workup is warranted.  I did inform the patient that according to [place]  law, she should not be driving for 6 months after the last syncopal episode.       Dispo: Follow-up in 6 to 8 weeks.  Signed, Scot Ford, PA

## 2023-11-26 NOTE — Patient Instructions (Signed)
 Medication Instructions:  Your physician recommends that you continue on your current medications as directed. Please refer to the Current Medication list given to you today.  *If you need a refill on your cardiac medications before your next appointment, please call your pharmacy*  Lab Work: TODAY:  DDIMER  If you have labs (blood work) drawn today and your tests are completely normal, you will receive your results only by: MyChart Message (if you have MyChart) OR A paper copy in the mail If you have any lab test that is abnormal or we need to change your treatment, we will call you to review the results.  Testing/Procedures: Your physician has requested that you have an echocardiogram. Echocardiography is a painless test that uses sound waves to create images of your heart. It provides your doctor with information about the size and shape of your heart and how well your heart's chambers and valves are working. This procedure takes approximately one hour. There are no restrictions for this procedure. Please do NOT wear cologne, perfume, aftershave, or lotions (deodorant is allowed). Please arrive 15 minutes prior to your appointment time.  Please note: We ask at that you not bring children with you during ultrasound (echo/ vascular) testing. Due to room size and safety concerns, children are not allowed in the ultrasound rooms during exams. Our front office staff cannot provide observation of children in our lobby area while testing is being conducted. An adult accompanying a patient to their appointment will only be allowed in the ultrasound room at the discretion of the ultrasound technician under special circumstances. We apologize for any inconvenience.   ZIO XT- Long Term Monitor Instructions  Your physician has requested you wear a ZIO patch monitor for 14 days.  This is a single patch monitor. [employer] supplies one patch monitor per enrollment. Additional stickers are not available.  Please do not apply patch if you will be having a Nuclear Stress Test,  Echocardiogram, Cardiac CT, MRI, or Chest Xray during the period you would be wearing the  monitor. The patch cannot be worn during these tests. You cannot remove and re-apply the  ZIO XT patch monitor.  Your ZIO patch monitor will be mailed 3 day USPS to your address on file. It may take 3-5 days  to receive your monitor after you have been enrolled.  Once you have received your monitor, please review the enclosed instructions. Your monitor  has already been registered assigning a specific monitor serial # to you.  Billing and Patient Assistance Program Information  We have supplied [employer] with any of your insurance information on file for billing purposes. [employer] offers a sliding scale Patient Assistance Program for patients that do not have  insurance, or whose insurance does not completely cover the cost of the ZIO monitor.  You must apply for the Patient Assistance Program to qualify for this discounted rate.  To apply, please call [employer] at [phone], select option 4, select option 2, ask to apply for  Patient Assistance Program. Meredeth will ask your household income, and how many people  are in your household. They will quote your out-of-pocket cost based on that information.  [employer] will also be able to set up a 48-month, interest-free payment plan if needed.  Applying the monitor   Shave hair from upper left chest.  Hold abrader disc by orange tab. Rub abrader in 40 strokes over the upper left chest as  indicated in your monitor instructions.  Clean area with 4 enclosed  alcohol pads. Let dry.  Apply patch as indicated in monitor instructions. Patch will be placed under collarbone on left  side of chest with arrow pointing upward.  Rub patch adhesive wings for 2 minutes. Remove white label marked 1. Remove the white  label marked 2. Rub patch adhesive wings for 2 additional minutes.  While  looking in a mirror, press and release button in center of patch. A small green light will  flash 3-4 times. This will be your only indicator that the monitor has been turned on.  Do not shower for the first 24 hours. You may shower after the first 24 hours.  Press the button if you feel a symptom. You will hear a small click. Record Date, Time and  Symptom in the Patient Logbook.  When you are ready to remove the patch, follow instructions on the last 2 pages of Patient  Logbook. Stick patch monitor onto the last page of Patient Logbook.  Place Patient Logbook in the blue and white box. Use locking tab on box and tape box closed  securely. The blue and white box has prepaid postage on it. Please place it in the mailbox as  soon as possible. Your physician should have your test results approximately 7 days after the  monitor has been mailed back to [facility].  Call [facility] Customer Care at [phone] if you have questions regarding  your ZIO XT patch monitor. Call them immediately if you see an orange light blinking on your  monitor.  If your monitor falls off in less than 4 days, contact our Monitor department at [phone].  If your monitor becomes loose or falls off after 4 days call [employer] at [phone] for  suggestions on securing your monitor   Follow-Up: At [facility], you and your health needs are our priority.  As part of our continuing mission to provide you with exceptional heart care, our providers are all part of one team.  This team includes your primary Cardiologist (physician) and Advanced Practice Providers or APPs (Physician Assistants and Nurse Practitioners) who all work together to provide you with the care you need, when you need it.  Your next appointment:   6 week(s)  Provider:   Scot Ford, PA-C          We recommend signing up for the patient portal called MyChart.  Sign up information is provided on this After Visit Summary.   MyChart is used to connect with patients for Virtual Visits (Telemedicine).  Patients are able to view lab/test results, encounter notes, upcoming appointments, etc.  Non-urgent messages can be sent to your provider as well.   To learn more about what you can do with MyChart, go to [email or web address].   Other Instructions

## 2023-11-26 NOTE — Progress Notes (Unsigned)
 Patient enrolled for [employer]/ [employer] to ship a 14 day long term monitor and Hydrocolloid strips for sensitive skin.  [facility] patient/ DOD to read.

## 2023-11-27 ENCOUNTER — Ambulatory Visit: Payer: Self-pay | Admitting: Physician Assistant

## 2023-11-27 NOTE — Progress Notes (Signed)
 Normal d-dimer after age adjustment. (Patient is 71, age adjusted cut-off is <0.65)

## 2023-11-27 NOTE — Telephone Encounter (Signed)
Spoke with pt regarding lab results. Pt verbalized understanding and had no further questions.

## 2023-11-29 ENCOUNTER — Ambulatory Visit: Admitting: Podiatry

## 2023-11-29 ENCOUNTER — Ambulatory Visit (INDEPENDENT_AMBULATORY_CARE_PROVIDER_SITE_OTHER)

## 2023-11-29 DIAGNOSIS — R252 Cramp and spasm: Secondary | ICD-10-CM | POA: Diagnosis not present

## 2023-11-29 DIAGNOSIS — M7731 Calcaneal spur, right foot: Secondary | ICD-10-CM

## 2023-11-29 DIAGNOSIS — M7732 Calcaneal spur, left foot: Secondary | ICD-10-CM | POA: Diagnosis not present

## 2023-11-29 DIAGNOSIS — M722 Plantar fascial fibromatosis: Secondary | ICD-10-CM

## 2023-11-29 DIAGNOSIS — M6289 Other specified disorders of muscle: Secondary | ICD-10-CM

## 2023-11-29 NOTE — Progress Notes (Signed)
 Chief Complaint  Patient presents with   Foot Orthotics    Wants new orthotics. Had them as a child. Her foot slips in her shoes. She had a cramp behind her knee a while back and thinks it is bc she doesn't have the orthotics. Feels like her feet are spreading out. No injury. No pain.  Not diabetic and ASA   Discussed the use of AI scribe software for clinical note transcription with the patient, who gave verbal consent to proceed.  History of Present Illness TANA TREFRY is a 63 year old female who presents with leg cramps and foot pain.  She experiences severe leg cramps that start from her heel and extend to her buttocks, often accompanied by sweating and nausea. A significant episode occurred three weeks ago at a family member's house, where she passed out due to the pain. She is currently undergoing evaluation by a cardiologist, with further testing ongoing.  She has a history of using orthotics as a child, initially hard plastic and later leather, but has not used custom orthotics recently. Over-the-counter inserts from a shoe store have helped with leg cramps but caused her feet to feel like they are 'collapsing and splaying.' Her toes have straightened with these inserts, but her foot slides on them due to the fabric material.  She recalls a massive leg cramp a couple of years ago and has received cortisone shots in the past for heel spurs. She describes a gradual change in her foot structure and perceives a difference in leg length, feeling that one leg is slightly longer than the other. She has not had any hip or knee surgeries.  She has a history of a broken toe and blood vessel damage as a child but no recorded ankle sprains. 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 No plantar fasciitis or significant arthritis pain currently.  No itching, burning, or significant skin issues on her feet. No significant  calluses and good skin condition. No history of scoliosis.    Past Medical History:  Diagnosis Date   Actinic keratosis    Allergy 1980   rash from penicillin after surgery   Anemia 1984   when I was pregnant with my first child. No one ever said anything about it after that time.   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   Melanoma in situ (HCC)    Obesity    Pneumonia    Sleep apnea It is still in question, not fully diagnosed   Past Surgical History:  Procedure Laterality Date   ABDOMINAL HYSTERECTOMY  1994   APPENDECTOMY  1980   CESAREAN SECTION  1984   CESAREAN SECTION  1988   EYE SURGERY  2020 & 2022   torn retina right eye/ detached retina  left eye   EYE SURGERY  2020 & 2022   Detached retina left eye, bubble & hole, belt and freeze   HERNIA REPAIR  1988   MELANOMA  EXCISION     OVARIAN CYST SURGERY  1980   RETINAL DETACHMENT SURGERY Left 12/2020   THORACOTOMY  2001   TONSILLECTOMY  1972   VEIN LIGATION AND STRIPPING     1996, 2006 & 2014   Allergies  Allergen Reactions   Latex    Codeine    Tetracyclines & Related Nausea And Vomiting   Erythromycin Rash   Penicillins Rash   Physical Exam Palpable pedal pulses bilateral.  No appreciable edema.  No ecchymosis or erythema is noted.  Manual muscle testing 5/5 bilateral.  Mild bony prominence/enlargement at the medial aspect of the navicular bilateral.  No pain on palpation of this area or along the posterior tibial tendon. Skin is normal.  Manual muscle testing 5/5.  Ankle dorsiflexion less than 10 degrees with the knee extended bilateral.  No pain  with compression of the calf bilateral.  No pain in the calf with dorsiflexion at the ankle.  Epicritic sensation intact   Results RADIOLOGY Bilateral foot X-ray: Inferior and posterior calcaneal spurs bilaterally, no fractures, accessory bones at the fifth metatarsals, joint spaces well-preserved, alignment satisfactory, navicular bone enlargement, possible posterior tibial tendinitis. (11/29/2023)    Assessment/Plan of Care: 1. Tightness of both gastrocnemius muscles   2. Plantar fasciitis, bilateral   3. Cramp and spasm   4. Calcaneal spur of right foot   5. Calcaneal spur of foot, left    Assessment & Plan Bilateral calcaneal spurs Inferior and posterior calcaneal spurs on bilateral foot radiographs, indicating past tightness of plantar fascia and Achilles tendon. No fractures present. - Scheduled consultation with pedorthist for custom orthotics evaluation and fitting.  Hammer toes Improvement in toe alignment with previous orthotics. Underlying biomechanical issues may still be present. - Will consider custom orthotics to maintain toe alignment.  Leg cramps Severe leg cramps with episodes of syncope. Cardiologist evaluation ongoing. Orthotics may alleviate cramps by optimizing foot position and reducing muscle overwork. - Scheduled consultation with pedorthist for custom orthotics evaluation and fitting.  Short Achilles tendon Previous orthotics with lifts for short Achilles tendon. Current orthotics may not provide adequate support. - Scheduled consultation with pedorthist for custom orthotics evaluation and fitting.  Enlarged navicular tuberosity bilateral Potential biomechanical issues due to navicular bone shape affecting tendon position. Orthotics may alleviate symptoms by improving biomechanical alignment. - Scheduled consultation with pedorthist for custom orthotics evaluation and fitting.   Awanda CHARM Imperial, DPM, FACFAS [facility]     [address] [place], [place] [address]                Office [phone]  Fax [phone]

## 2023-12-02 NOTE — Patient Instructions (Signed)

## 2023-12-11 ENCOUNTER — Ambulatory Visit

## 2023-12-11 NOTE — Progress Notes (Signed)
 Orthotics   Patient was present and evaluated for Custom molded foot orthotics. Patient will benefit from CFO's to provide total contact to BIL MLA's helping to balance and distribute body weight more evenly across BIL feet helping to reduce plantar pressure and pain. Orthotic will also encourage FF / RF alignment  Patient was scanned today and will return for fitting upon receipt  [employer] no coins no ded   Lolita Schultze CPed

## 2023-12-24 ENCOUNTER — Ambulatory Visit (HOSPITAL_COMMUNITY)

## 2023-12-26 ENCOUNTER — Encounter: Payer: Self-pay | Admitting: Family Medicine

## 2023-12-27 ENCOUNTER — Ambulatory Visit (HOSPITAL_COMMUNITY): Admission: RE | Admit: 2023-12-27 | Discharge: 2023-12-27 | Attending: Internal Medicine

## 2023-12-27 DIAGNOSIS — R55 Syncope and collapse: Secondary | ICD-10-CM | POA: Diagnosis present

## 2023-12-27 LAB — ECHOCARDIOGRAM COMPLETE
Area-P 1/2: 4.52 cm2
S' Lateral: 3 cm

## 2024-01-02 DIAGNOSIS — R55 Syncope and collapse: Secondary | ICD-10-CM | POA: Diagnosis not present

## 2024-01-02 LAB — HM MAMMOGRAPHY

## 2024-01-07 ENCOUNTER — Ambulatory Visit (INDEPENDENT_AMBULATORY_CARE_PROVIDER_SITE_OTHER)

## 2024-01-07 ENCOUNTER — Other Ambulatory Visit (INDEPENDENT_AMBULATORY_CARE_PROVIDER_SITE_OTHER)

## 2024-01-07 DIAGNOSIS — Z23 Encounter for immunization: Secondary | ICD-10-CM | POA: Diagnosis not present

## 2024-01-07 DIAGNOSIS — E039 Hypothyroidism, unspecified: Secondary | ICD-10-CM | POA: Diagnosis not present

## 2024-01-07 NOTE — Progress Notes (Signed)
 Patient is in office today for a nurse visit for Flu Shot. Patient Injection was given in the  Right deltoid. Patient tolerated injection well.

## 2024-01-07 NOTE — Addendum Note (Signed)
 Addended by: ROLLENE CANE A on: 01/07/2024 11:43 AM   Modules accepted: Orders

## 2024-01-08 LAB — TSH+FREE T4: TSH W/REFLEX TO FT4: 4.55 m[IU]/L — ABNORMAL HIGH (ref 0.40–4.50)

## 2024-01-08 LAB — T4, FREE: Free T4: 1 ng/dL (ref 0.8–1.8)

## 2024-01-10 ENCOUNTER — Ambulatory Visit: Payer: Self-pay | Admitting: Family Medicine

## 2024-01-10 ENCOUNTER — Encounter: Payer: Self-pay | Admitting: Physician Assistant

## 2024-01-10 ENCOUNTER — Ambulatory Visit: Attending: Cardiovascular Disease | Admitting: Physician Assistant

## 2024-01-10 VITALS — BP 149/85 | HR 60 | Ht 66.0 in | Wt 215.2 lb

## 2024-01-10 DIAGNOSIS — R55 Syncope and collapse: Secondary | ICD-10-CM | POA: Diagnosis not present

## 2024-01-10 DIAGNOSIS — Z01812 Encounter for preprocedural laboratory examination: Secondary | ICD-10-CM

## 2024-01-10 DIAGNOSIS — I77810 Thoracic aortic ectasia: Secondary | ICD-10-CM

## 2024-01-10 DIAGNOSIS — I10 Essential (primary) hypertension: Secondary | ICD-10-CM | POA: Diagnosis not present

## 2024-01-10 MED ORDER — HYDROCHLOROTHIAZIDE 25 MG PO TABS: 25.0000 mg | ORAL_TABLET | Freq: Every day | ORAL | 3 refills | Status: AC

## 2024-01-10 NOTE — Patient Instructions (Addendum)
 Medication Instructions:  Your physician has recommended you make the following change in your medication:  INCREASE: Hydrochlorothiazide  25 mg once daily  *If you need a refill on your cardiac medications before your next appointment, please call your pharmacy*  Lab Work: In 1 year before Chest CTA: BMET If you have labs (blood work) drawn today and your tests are completely normal, you will receive your results only by: MyChart Message (if you have MyChart) OR A paper copy in the mail If you have any lab test that is abnormal or we need to change your treatment, we will call you to review the results.  Testing/Procedures: Non-Cardiac CT Angiography (CTA), is a special type of CT scan that uses a computer to produce multi-dimensional views of major blood vessels throughout the body. In CT angiography, a contrast material is injected through an IV to help visualize the blood vessels   Follow-Up: At [facility], you and your health needs are our priority.  As part of our continuing mission to provide you with exceptional heart care, our providers are all part of one team.  This team includes your primary Cardiologist (physician) and Advanced Practice Providers or APPs (Physician Assistants and Nurse Practitioners) who all work together to provide you with the care you need, when you need it.  Your next appointment:   1 year(s)  Provider:   Maude Emmer, MD  Please take your blood pressure daily for 2 weeks and send in a MyChart message. Please include heart rates. (One message at the end of the 2 weeks).   HOW TO TAKE YOUR BLOOD PRESSURE: Rest 5 minutes before taking your blood pressure. Dont smoke or drink caffeinated beverages for at least 30 minutes before. Take your blood pressure before (not after) you eat. Sit comfortably with your back supported and both feet on the floor (dont cross your legs). Elevate your arm to heart level on a table or a desk. Use the proper  sized cuff. It should fit smoothly and snugly around your bare upper arm. There should be enough room to slip a fingertip under the cuff. The bottom edge of the cuff should be 1 inch above the crease of the elbow. Ideally, take 3 measurements at one sitting and record the average.  Blood Pressure Log Date/Time Medications taken? (Y/N) Blood Pressure Heart Rate/Pulse

## 2024-01-10 NOTE — Progress Notes (Unsigned)
°  Cardiology Office Note   Date:  01/10/2024  ID:  Leah Todd, DOB 02/04/1960, MRN [id number] PCP: Leah Beverley NOVAK, MD  [facility] Providers Cardiologist:  None { Click to update primary MD,subspecialty MD or APP then REFRESH:1}    History of Present Illness Leah Todd is a 63 y.o. 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  PFT in December 2023 was normal.  Patient has been followed by Dr. Annella of pulmonology service for evaluation of elevated bicarbonate.   Patient was recently seen by PCP on 11/09/2023 for syncopal episode.  She reportedly had a syncopal episode after standing from reclined position following bilateral leg cramping.  She also had prior episode of syncope several years ago.  EKG and orthostatic measurements were normal.  TSH elevated at 5.24.  Urinalysis showed 1+ leukocyte but negative nitrite or bacteria.  Normal red blood cell count.  Normal renal function and electrolyte.  Normal magnesium.  Normal free T4.  No gross abnormality in the urine culture.  I last saw the patient on 11/26/2023 for evaluation of syncope.  Symptom occurred while she was in [place]  on 11/02/2023.  She did not seek medical attention immediately, she saw her PCP a week later.  She described the symptom as flushing sensation, nausea and the dizziness followed by 30 seconds of passing out spell.  I ordered D-dimer to rule out PE.  Given the flushing sensation, I suspect her symptom is more vasovagal in nature.  I also recommended 2-week heart monitor and echocardiogram.  D-dimer came back 0.52 which is negative based on age adjustment.  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  Heart monitor shows a single  episode of SVT but no significant arrhythmia.  PVC burden less than 1%.  ROS: ***  Studies Reviewed      *** Risk Assessment/Calculations {Does this patient have ATRIAL FIBRILLATION?:[phone]} The patient's 1st BP is elevated (>139/89)*** Repeat BP and {Click to enter a 2nd BP Refresh Note  :1}       Physical Exam VS:  BP (!) 149/85 (BP Location: Right Arm, Patient Position: Sitting, Cuff Size: Large)   Pulse 60   Ht 5' 6 (1.676 m)   Wt 215 lb 3.2 oz (97.6 kg)   SpO2 98%   BMI 34.73 kg/m        Wt Readings from Last 3 Encounters:  01/10/24 215 lb 3.2 oz (97.6 kg)  11/26/23 212 lb 6.4 oz (96.3 kg)  11/09/23 209 lb 12.8 oz (95.2 kg)    GEN: Well nourished, well developed in no acute distress NECK: No JVD; No carotid bruits CARDIAC: ***RRR, no murmurs, rubs, gallops RESPIRATORY:  Clear to auscultation without rales, wheezing or rhonchi  ABDOMEN: Soft, non-tender, non-distended EXTREMITIES:  No edema; No deformity   ASSESSMENT AND PLAN ***    {Are you ordering a CV Procedure (e.g. stress test, cath, DCCV, TEE, etc)?   Press F2        :[id number]}  Dispo: ***  Signed, Scot Ford, PA

## 2024-01-22 ENCOUNTER — Encounter: Payer: Self-pay | Admitting: Family Medicine

## 2024-01-22 DIAGNOSIS — R928 Other abnormal and inconclusive findings on diagnostic imaging of breast: Secondary | ICD-10-CM

## 2024-01-23 ENCOUNTER — Ambulatory Visit (INDEPENDENT_AMBULATORY_CARE_PROVIDER_SITE_OTHER): Admitting: Podiatry

## 2024-01-23 DIAGNOSIS — M722 Plantar fascial fibromatosis: Secondary | ICD-10-CM

## 2024-01-23 DIAGNOSIS — M7732 Calcaneal spur, left foot: Secondary | ICD-10-CM | POA: Diagnosis not present

## 2024-01-23 DIAGNOSIS — M7731 Calcaneal spur, right foot: Secondary | ICD-10-CM

## 2024-01-23 NOTE — Telephone Encounter (Signed)
 Copied from CRM #[id number]. Topic: Clinical - Request for Lab/Test Order >> Jan 22, 2024 10:45 AM Drema MATSU wrote: Reason for CRM: Patient went to have a mammogram but they sent over an order for Ultrasound or a Sonogram order that was requested by electronic fax on 12/17 from [facility].  She doesn't remember which office it came from. Please call patient with an update.

## 2024-01-23 NOTE — Progress Notes (Signed)
 Patient fitted and dispensed her orthotics.  As she did have some initial concerns feeling like the arch might not be in the correct area.  She was instructed on the break-in process over the next couple of weeks.  If by the end of 2 weeks, it still does not feel that this is adequate support in the correct location, she will need to see our representative in the orthotics lab to have the custom orthotics sent back for possible adjustment.  She did sign the proof of delivery form and insurance waiver for her insurance today.

## 2024-01-25 ENCOUNTER — Other Ambulatory Visit: Payer: Self-pay | Admitting: Family

## 2024-01-25 ENCOUNTER — Telehealth: Payer: Self-pay

## 2024-01-25 DIAGNOSIS — N6323 Unspecified lump in the left breast, lower outer quadrant: Secondary | ICD-10-CM

## 2024-01-25 NOTE — Telephone Encounter (Signed)
 Copied from CRM [id number]. Topic: General - Other >> Jan 25, 2024  4:58 PM Nessti S wrote: Reason for CRM: pt called about an order from radiology that was sent 01/09/24. Order was received. Relayed message to pt; she will call back Monday for an update.

## 2024-01-28 NOTE — Telephone Encounter (Signed)
 Noted

## 2024-01-29 NOTE — Addendum Note (Signed)
 Addended by: SEBASTIAN CROCK B on: 01/29/2024 03:58 PM   Modules accepted: Orders

## 2024-01-29 NOTE — Telephone Encounter (Signed)
 Ultrasound order signed

## 2024-01-29 NOTE — Addendum Note (Signed)
 Addended by: ALESSANDRA DEDRA PARAS on: 01/29/2024 11:28 AM   Modules accepted: Orders

## 2024-01-31 NOTE — Telephone Encounter (Signed)
 Order req printed and placed in PCP office for signature.

## 2024-02-06 ENCOUNTER — Ambulatory Visit (INDEPENDENT_AMBULATORY_CARE_PROVIDER_SITE_OTHER): Admitting: Podiatrist

## 2024-02-06 DIAGNOSIS — M722 Plantar fascial fibromatosis: Secondary | ICD-10-CM

## 2024-02-06 NOTE — Progress Notes (Signed)
 Orthotic adjusted . Arch was adjusted and moleskin applied to orthotics and shoes to reduce squeak.  She will call if any further adjutments need to be made.

## 2024-02-07 NOTE — Telephone Encounter (Signed)
 Signed breast US  order was faxed by R. Crawford, CMA.
